# Patient Record
Sex: Male | Born: 1980 | Race: White | Hispanic: No
Health system: Midwestern US, Community
[De-identification: ages and names within clinical notes are randomized; demographics above are authoritative.]

## PROBLEM LIST (undated history)

## (undated) DIAGNOSIS — K649 Unspecified hemorrhoids: Secondary | ICD-10-CM

## (undated) DIAGNOSIS — K219 Gastro-esophageal reflux disease without esophagitis: Secondary | ICD-10-CM

## (undated) DIAGNOSIS — K449 Diaphragmatic hernia without obstruction or gangrene: Secondary | ICD-10-CM

## (undated) HISTORY — DX: Diaphragmatic hernia without obstruction or gangrene: K44.9

## (undated) HISTORY — PX: COLON SURGERY: SHX602

## (undated) HISTORY — DX: Unspecified hemorrhoids: K64.9

## (undated) HISTORY — DX: Gastro-esophageal reflux disease without esophagitis: K21.9

## (undated) HISTORY — PX: NO PAST SURGERIES: SHX2092

---
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
## 2004-04-03 ENCOUNTER — Ambulatory Visit: Payer: Self-pay

## 2008-01-19 ENCOUNTER — Emergency Department: Payer: Self-pay | Admitting: Internal Medicine

## 2009-05-10 ENCOUNTER — Ambulatory Visit: Payer: Self-pay | Admitting: Gastroenterology

## 2009-05-11 ENCOUNTER — Ambulatory Visit: Payer: Self-pay | Admitting: Gastroenterology

## 2011-04-27 ENCOUNTER — Emergency Department: Payer: Self-pay | Admitting: Emergency Medicine

## 2012-03-23 ENCOUNTER — Other Ambulatory Visit: Payer: Self-pay | Admitting: Orthopedic Surgery

## 2012-03-23 DIAGNOSIS — M25569 Pain in unspecified knee: Secondary | ICD-10-CM | POA: Insufficient documentation

## 2012-03-23 DIAGNOSIS — M25562 Pain in left knee: Secondary | ICD-10-CM

## 2012-03-27 ENCOUNTER — Ambulatory Visit
Admission: RE | Admit: 2012-03-27 | Discharge: 2012-03-27 | Disposition: A | Discharge status: Home / Self Care | Payer: PRIVATE HEALTH INSURANCE | Source: Ambulatory Visit | Attending: Orthopedic Surgery | Admitting: Orthopedic Surgery

## 2012-03-27 DIAGNOSIS — M25562 Pain in left knee: Secondary | ICD-10-CM

## 2012-10-20 ENCOUNTER — Emergency Department: Payer: Self-pay | Admitting: Emergency Medicine

## 2013-03-28 ENCOUNTER — Emergency Department: Payer: Self-pay | Admitting: Emergency Medicine

## 2013-03-28 LAB — CBC WITH DIFFERENTIAL/PLATELET
Basophil #: 0.1 10*3/uL (ref 0.0–0.1)
Basophil %: 0.6 %
Eosinophil #: 0.3 10*3/uL (ref 0.0–0.7)
Eosinophil %: 2.6 %
HCT: 47.6 % (ref 40.0–52.0)
HGB: 15.9 g/dL (ref 13.0–18.0)
LYMPHS ABS: 1.7 10*3/uL (ref 1.0–3.6)
LYMPHS PCT: 15 %
MCH: 29.7 pg (ref 26.0–34.0)
MCHC: 33.4 g/dL (ref 32.0–36.0)
MCV: 89 fL (ref 80–100)
MONO ABS: 1 x10 3/mm (ref 0.2–1.0)
MONOS PCT: 8.9 %
Neutrophil #: 8.5 10*3/uL — ABNORMAL HIGH (ref 1.4–6.5)
Neutrophil %: 72.9 %
Platelet: 223 10*3/uL (ref 150–440)
RBC: 5.35 10*6/uL (ref 4.40–5.90)
RDW: 13.5 % (ref 11.5–14.5)
WBC: 11.6 10*3/uL — AB (ref 3.8–10.6)

## 2013-03-28 LAB — URINALYSIS, COMPLETE
BILIRUBIN, UR: NEGATIVE
BLOOD: NEGATIVE
Bacteria: NONE SEEN
GLUCOSE, UR: NEGATIVE mg/dL (ref 0–75)
KETONE: NEGATIVE
Leukocyte Esterase: NEGATIVE
NITRITE: NEGATIVE
PROTEIN: NEGATIVE
Ph: 7 (ref 4.5–8.0)
RBC,UR: 2 /HPF (ref 0–5)
SPECIFIC GRAVITY: 1.02 (ref 1.003–1.030)
SQUAMOUS EPITHELIAL: NONE SEEN
WBC UR: NONE SEEN /HPF (ref 0–5)

## 2013-03-28 LAB — BASIC METABOLIC PANEL
Anion Gap: 1 — ABNORMAL LOW (ref 7–16)
BUN: 13 mg/dL (ref 7–18)
Calcium, Total: 9.1 mg/dL (ref 8.5–10.1)
Chloride: 104 mmol/L (ref 98–107)
Co2: 30 mmol/L (ref 21–32)
Creatinine: 1.36 mg/dL — ABNORMAL HIGH (ref 0.60–1.30)
EGFR (Non-African Amer.): >60
Glucose: 80 mg/dL (ref 65–99)
OSMOLALITY: 269 (ref 275–301)
Potassium: 4.4 mmol/L (ref 3.5–5.1)
SODIUM: 135 mmol/L — AB (ref 136–145)

## 2014-11-14 ENCOUNTER — Encounter: Payer: Self-pay | Admitting: Surgery

## 2014-11-14 ENCOUNTER — Ambulatory Visit (INDEPENDENT_AMBULATORY_CARE_PROVIDER_SITE_OTHER): Payer: Self-pay | Admitting: Surgery

## 2014-11-14 ENCOUNTER — Encounter (INDEPENDENT_AMBULATORY_CARE_PROVIDER_SITE_OTHER): Payer: Self-pay

## 2014-11-14 VITALS — BP 135/89 | HR 70 | Temp 98.6°F | Ht 73.0 in | Wt 152.0 lb

## 2014-11-14 DIAGNOSIS — K641 Second degree hemorrhoids: Secondary | ICD-10-CM

## 2014-11-14 MED ORDER — HYDROCORTISONE ACETATE 25 MG RE SUPP: 25.0000 mg | Freq: Two times a day (BID) | RECTAL | Status: DC | PRN

## 2014-11-14 MED ORDER — POLYETHYLENE GLYCOL 3350 17 G PO PACK: 17.0000 g | PACK | Freq: Every day | ORAL | Status: DC

## 2014-11-14 MED ORDER — LIDOCAINE 5 % EX OINT: 1.0000 "application " | TOPICAL_OINTMENT | CUTANEOUS | Status: DC | PRN

## 2014-11-14 NOTE — Patient Instructions (Signed)
You have been given several prescriptions today to help make your bowel movements easier and your hemorrhoids decrease in size and not cause you as much discomfort. Please take these to your pharmacy.  You have also been given a sheet of Primary Care Physicians. Please call and get established with a PCP to handle your other problems that you have mentioned today.

## 2014-11-14 NOTE — Progress Notes (Signed)
General Surgery Consult Note  CC: Hemorrhoids, BRBPR, straining while on toilet, urinary hesitancy/incontinence  HPI: [name] is a pleasant 34 yo M with a history of workup for intermittent bm who presents with protruding perianal masses and other vague symptoms.  Says that he has noticed that he has had hemorrhoids in the past and that these were seen on a previous colonoscopy.  Since then it has become increasingly difficult to have bm without having the sensation of needing to have more.  + BRB in toilet after BM.  Also notices anal nodules which are easily reducible which stick out when straining.  Has tried OTC suppositories with some relief.  Also complains of bilateral groin pain with BM as well as difficulty starting urinating as well as dribbling post void.  Has been embarrassed about having hemorrhoids looked at.  No fevers/chills, night sweats, shortness of breath, cough, chest pain, dysuria/hematuria.  Active Ambulatory Problems    Diagnosis Date Noted  . No Active Ambulatory Problems   Resolved Ambulatory Problems    Diagnosis Date Noted  . No Resolved Ambulatory Problems   Past Medical History  Diagnosis Date  . GERD (gastroesophageal reflux disease)   . Hiatal hernia    Past Surgical History  Procedure Laterality Date  . No past surgeries       Medication List       This list is accurate as of: 11/14/14 11:02 AM.  Always use your most recent med list.               hydrocortisone 25 MG suppository  Commonly known as:  ANUSOL-HC  Place 1 suppository (25 mg total) rectally 2 (two) times daily as needed for hemorrhoids or itching.     lidocaine 5 % ointment  Commonly known as:  XYLOCAINE  Apply 1 application topically as needed.     nicotine polacrilex 4 MG gum  Commonly known as:  NICORETTE  Take 4 mg by mouth as needed for smoking cessation.     omeprazole 20 MG capsule  Commonly known as:  PRILOSEC  Take 20 mg by mouth daily.     polyethylene glycol  packet  Commonly known as:  MIRALAX / GLYCOLAX  Take 17 g by mouth daily.       No Known Allergies   Social History   Social History  . Marital Status: Divorced    Spouse Name: N/A  . Number of Children: N/A  . Years of Education: N/A   Occupational History  . Not on file.   Social History Main Topics  . Smoking status: Former Smoker    Quit date: 11/13/2005  . Smokeless tobacco: Former Neurosurgeon    Quit date: 11/07/2014  . Alcohol Use: Yes     Comment: Occasional  . Drug Use: No  . Sexual Activity: Not on file   Other Topics Concern  . Not on file   Social History Narrative  . No narrative on file   Family History  Problem Relation Age of Onset  . ADD / ADHD Son   . Kidney disease Paternal Grandmother   . Heart disease Paternal Grandmother    ROS: Full ROS obtained, pertinent positives and negatives as above.    Blood pressure 135/89, pulse 70, temperature 98.6 F (37 C), temperature source Oral, height  (1.854 m), weight 152 lb (68.947 kg). GEN: NAD/A&Ox3 FACE: no obvious facial trauma, normal external nose, normal external ears EYES: no scleral icterus, no conjunctivitis HEAD: normocephalic  atraumatic CV: RRR, no MRG RESP: moving air well, lungs clear ABD: soft, nontender, nondistended ANUS: + internal hemorrhoids, no prolapse, congested EXT: moving all ext well, strength 5/5 NEURO: cnII-XII grossly intact, sensation intact all 4 ext  Labs: No recent labs Imaging: No recent imaging  A/P 34 yo with grade 2 hemorrhoids, what appears to be constipation as well as vague urinary symptoms.  I have discussed and recommended against surgical intervention at this time and have recommended nonoperative therapy due to his occupation which requires significant traveling.  - topical lidocaine, miralax and anusol suppositories - refer to PCP regarding urinary issues  I have personally spent > 30 min face to face time with this encounter.

## 2014-12-01 DIAGNOSIS — K641 Second degree hemorrhoids: Secondary | ICD-10-CM

## 2014-12-06 ENCOUNTER — Ambulatory Visit: Payer: Self-pay | Admitting: Surgery

## 2017-02-26 NOTE — Progress Notes (Signed)
02/27/2017 11:13 AM   Brandon Bottom Sr. October 15, 1980 [id number]  Referring provider: No referring provider defined for this encounter.  Chief Complaint  Patient presents with  . VAS Consult    New Patient    HPI: [name] is a 37 year old Caucasian presents today requesting a vasectomy.  Patient has two children, 2 sons, who desires no further biological children.  Patient denies any history of chronic prostatitis, epididymitis, orchitis, or other genital pain.  Today, we discussed what the vas deferens is, where it is located, and its function. We reviewed the procedure for vasectomy, it's risks, benefits, alternatives, and likelihood of achieving his goals.   We discussed in detail the procedure, complications, and recovery as well as the need for clearance prior to unprotected intercourse. We discussed that vasectomy does not protect against sexually transmitted diseases. We discussed that this procedure does not result in immediate sterility and that they would need to use other forms of birth control until he has been cleared with a three month negative postvasectomy semen analyses.  I explained that the procedure is considered to be permanent and that attempts at reversal have varying degrees of success. These options include vasectomy reversal, sperm retrieval, and in vitro fertilization; these can be very expensive.   We discussed the chance of postvasectomy pain syndrome which occurs in less than 5% of patients. I explained to the patient that there is no treatment to resolve this chronic pain, and that if it developed I would not be able to help resolve the issue, but that surgery is generally not needed for correction.   I explained there have even been reports of systemic like illness associated with this chronic pain, and that there was no good cure. I explained that vasectomy it is not a 100% reliable form of birth control, and the risk of pregnancy after  vasectomy is approximately 1 in 2000 men who had a negative postvasectomy semen analysis or rare non-motile sperm.  I explained that repeat vasectomy was necessary in less than 1% of vasectomy procedures when employing the type of technique that is performed in the office. I explained that he should refrain from ejaculation for approximately one week following vasectomy. I explained that there are other options for birth control which are permanent and non-permanent; we discussed these.  I explained the rates of surgical complications, such as symptomatic hematoma or infection, are low (1-2%) and vary with the surgeon's experience and criteria used to diagnose the complication.   PNH: Past Medical History:  Diagnosis Date  . GERD (gastroesophageal reflux disease)   . Hemorrhoid   . Hiatal hernia     Surgical History: Past Surgical History:  Procedure Laterality Date  . LEG SURGERY  1998  . NO PAST SURGERIES      Home Medications:  Allergies as of 02/27/2017   No Known Allergies     Medication List        Accurate as of 02/27/17 11:13 AM. Always use your most recent med list.          diazepam 10 MG tablet Commonly known as:  VALIUM Take 1 tablet (10 mg total) by mouth once for 1 dose.       Allergies: No Known Allergies  Family History: Family History  Problem Relation Age of Onset  . ADD / ADHD Son   . Kidney disease Paternal Grandmother   . Heart disease Paternal Grandmother   . Prostate  cancer Maternal Grandfather     Social History:  reports that he quit smoking about 11 years ago. He quit smokeless tobacco use about 2 years ago. He reports that he drinks alcohol. He reports that he does not use drugs.  ROS: UROLOGY Frequent Urination?: No Hard to postpone urination?: No Burning/pain with urination?: No Get up at night to urinate?: No Leakage of urine?: No Urine stream starts and stops?: No Trouble starting stream?: No Do you have to strain to urinate?:  No Blood in urine?: No Urinary tract infection?: No Sexually transmitted disease?: No Injury to kidneys or bladder?: No Painful intercourse?: No Weak stream?: No Erection problems?: No Penile pain?: No  Gastrointestinal Nausea?: No Vomiting?: No Indigestion/heartburn?: Yes Diarrhea?: No Constipation?: No  Constitutional Fever: No Night sweats?: No Weight loss?: No Fatigue?: No  Skin Skin rash/lesions?: No Itching?: No  Eyes Blurred vision?: No Double vision?: No  Ears/Nose/Throat Sore throat?: No Sinus problems?: No  Hematologic/Lymphatic Swollen glands?: No Easy bruising?: No  Cardiovascular Leg swelling?: No Chest pain?: No  Respiratory Cough?: No Shortness of breath?: No  Endocrine Excessive thirst?: No  Musculoskeletal Back pain?: No Joint pain?: No  Neurological Headaches?: No Dizziness?: No  Psychologic Depression?: No Anxiety?: No  Physical Exam: BP 118/68   Pulse 64   Ht 6\' 1"  (1.854 m)   Wt 145 lb (65.8 kg)   BMI 19.13 kg/m   Constitutional: Well nourished. Alert and oriented, No acute distress. HEENT: Cable AT, moist mucus membranes. Trachea midline, no masses. Cardiovascular: No clubbing, cyanosis, or edema. Respiratory: Normal respiratory effort, no increased work of breathing. GI: Abdomen is soft, non tender, non distended, no abdominal masses. Liver and spleen not palpable.  No hernias appreciated.  Stool sample for occult testing is not indicated.   GU: No CVA tenderness.  No bladder fullness or masses.  Patient with circumcised phallus.  Urethral meatus is patent.  No penile discharge. No penile lesions or rashes. Scrotum without lesions, cysts, rashes and/or edema.  Testicles are located scrotally bilaterally. No masses are appreciated in the testicles. Left and right epididymis are normal.  Vas deferens present bilaterally.   Rectal: Not performed.   Skin: No rashes, bruises or suspicious lesions. Lymph: No cervical or  inguinal adenopathy. Neurologic: Grossly intact, no focal deficits, moving all 4 extremities. Psychiatric: Normal mood and affect.      Assessment & Plan:   1. Vasectomy consult:  Patient has read and signed the consent.  He is given the pre-op vasectomy instruction sheet.  He is prescribed Valium 10 mg and instructed to take it 30 minutes prior to his vasectomy appointment.  He is to have a driver.  I reemphasized to the patient that this is to be considered a permanent form of birth control, that he is to use an alternative form of birth control until we receive the 3 months specimen and it is cleared of sperm and that this will not prevent STI's.  His questions are answered to his satisfaction and he understands the risks and is willing to proceed with the vasectomy.  He will schedule his vasectomy.    I spent 30 minutes in a face-to-face conversation concerning the vasectomy procedure and pre-and post op expectations.  Greater than 50% was spent in counseling & coordination of care with the patient.   Return for vasectomy.  These notes generated with voice recognition software. I apologize for typographical errors.  Michiel CowboySHANNON Ashutosh Dieguez, PA-C  [facility] [address],  [place], [place] [address] [phone]

## 2017-02-27 ENCOUNTER — Ambulatory Visit (INDEPENDENT_AMBULATORY_CARE_PROVIDER_SITE_OTHER): Payer: BLUE CROSS/BLUE SHIELD | Admitting: Urology

## 2017-02-27 ENCOUNTER — Encounter: Payer: Self-pay | Admitting: Urology

## 2017-02-27 VITALS — BP 118/68 | HR 64 | Ht 73.0 in | Wt 145.0 lb

## 2017-02-27 DIAGNOSIS — Z3009 Encounter for other general counseling and advice on contraception: Secondary | ICD-10-CM | POA: Diagnosis not present

## 2017-02-27 MED ORDER — DIAZEPAM 10 MG PO TABS: 10.0000 mg | ORAL_TABLET | Freq: Once | ORAL | 0 refills | Status: DC

## 2017-02-27 MED ORDER — DIAZEPAM 10 MG PO TABS: 10.0000 mg | ORAL_TABLET | Freq: Once | ORAL | 0 refills | Status: AC

## 2017-03-13 ENCOUNTER — Ambulatory Visit (INDEPENDENT_AMBULATORY_CARE_PROVIDER_SITE_OTHER): Payer: BLUE CROSS/BLUE SHIELD | Admitting: Urology

## 2017-03-13 ENCOUNTER — Encounter: Payer: Self-pay | Admitting: Urology

## 2017-03-13 VITALS — BP 135/68 | HR 77 | Ht 73.0 in | Wt 145.0 lb

## 2017-03-13 DIAGNOSIS — Z302 Encounter for sterilization: Secondary | ICD-10-CM | POA: Diagnosis not present

## 2017-03-13 NOTE — Progress Notes (Signed)
03/13/17  CC:  Chief Complaint  Patient presents with  . VAS    HPI: 37 yo M with 3 children who desires no further biological children presenting today for vasectomy.  Risk and benefits previously reviewed in detail.    Blood pressure 135/68, pulse 77, height 6\' 1"  (1.854 m), weight 145 lb (65.8 kg). NED. A&Ox3.   No respiratory distress   Abd soft, NT, ND Normal external genitalia with patent urethral meatus   Bilateral Vasectomy Procedure  Pre-Procedure: - Patient's scrotum was prepped and draped for vasectomy. - The vas was palpated through the scrotal skin on the left. - 1% Xylocaine was injected into the skin and surrounding tissue for placement  - In a similar manner, the vas on the right was identified, anesthetized, and stabilized.  Procedure: - An #11 blade was used to make a small stab incision in the latera scrotal overlying the vas - The left vas was isolated and brought up through the incision exposing that structure. - Bleeding points were cauterized as they occurred. - The vas was free from the surrounding structures and brought to the view. - A segment was positioned for placement with a hemostat. - A second hemostat was placed and a small segment between the two hemostats and was removed for inspection. - Each end of the transected vas lumen was fulgurated/ obliterated using needlepoint electrocautery -A fascial interposition was performed on testicular end of the vas using #3-0 chromic suture -The same procedure was performed on the right. - A single suture of #3-0 chromic catgut was used to close each lateral scrotal skin incision - A dressing was applied.  Post-Procedure: - Patient was instructed in care of the operative area - A specimen is to be delivered in 12 weeks   -Another form of contraception is to be used until post vasectomy semen analysis  Vanna ScotlandAshley Rheana Casebolt, MD

## 2017-03-25 ENCOUNTER — Ambulatory Visit: Payer: BLUE CROSS/BLUE SHIELD | Admitting: Surgery

## 2017-03-25 ENCOUNTER — Encounter: Payer: Self-pay | Admitting: Surgery

## 2017-03-25 VITALS — BP 124/85 | HR 79 | Temp 98.2°F | Ht 73.0 in | Wt 151.2 lb

## 2017-03-25 DIAGNOSIS — K648 Other hemorrhoids: Secondary | ICD-10-CM

## 2017-03-25 MED ORDER — LIDOCAINE 5 % EX OINT: 1.0000 "application " | TOPICAL_OINTMENT | Freq: Three times a day (TID) | CUTANEOUS | 0 refills | Status: DC | PRN

## 2017-03-25 MED ORDER — HYDROCORTISONE 2.5 % RE CREA: 1.0000 "application " | TOPICAL_CREAM | Freq: Two times a day (BID) | RECTAL | 0 refills | Status: DC

## 2017-03-25 NOTE — Progress Notes (Signed)
03/25/2017  History of Present Illness: Brandon BottomBenjamin D Takeshita Sr. is a 37 y.o. male who presents with a long history of internal hemorrhoids.  He was last seen in our office on 10/2014 by Dr. Juliann PulseLundquist and conservative management was started.  He did well initially but now after a few years, he reports that he's having blood with bowel movements about once per week and he also notices at times blood in his underwear.  He also describes some discomfort with ambulating.  He denies constipation but reports that sometimes he does to strain for a bowel movement.  He has tried Preparation H at home but this has not helped.  He does feel something enlarged.  Denies any abdominal pain.  He last had any blood per rectum 2 days ago.  Past Medical History: Past Medical History:  Diagnosis Date  . GERD (gastroesophageal reflux disease)   . Hemorrhoid   . Hiatal hernia      Past Surgical History: Past Surgical History:  Procedure Laterality Date  . LEG SURGERY  1998  . NO PAST SURGERIES      Home Medications: Prior to Admission medications   Medication Sig Start Date End Date Taking? Authorizing Provider  hydrocortisone (ANUSOL-HC) 2.5 % rectal cream Place 1 application rectally 2 (two) times daily. 03/25/17   Henrene DodgePiscoya, Mckynna Vanloan, MD  hydrocortisone (ANUSOL-HC) 2.5 % rectal cream Place 1 application rectally 2 (two) times daily. 03/25/17   Bailie Christenbury, Elita QuickJose, MD  lidocaine (XYLOCAINE) 5 % ointment Apply 1 application topically 3 (three) times daily as needed. May apply up to 6 times a day. 03/25/17   Henrene DodgePiscoya, Kory Rains, MD    Allergies: No Known Allergies  Review of Systems: Review of Systems  Constitutional: Negative for chills and fever.  Respiratory: Negative for shortness of breath.   Cardiovascular: Negative for chest pain.  Gastrointestinal: Positive for blood in stool. Negative for abdominal pain, diarrhea, nausea and vomiting.    Physical Exam BP 124/85   Pulse 79   Temp 98.2 F (36.8 C) (Oral)   Ht 6'  1" (1.854 m)   Wt 68.6 kg (151 lb 3.2 oz)   BMI 19.95 kg/m  CONSTITUTIONAL: No acute distress RESPIRATORY:  Lungs are clear, and breath sounds are equal bilaterally. Normal respiratory effort without pathologic use of accessory muscles. CARDIOVASCULAR: Heart is regular without murmurs, gallops, or rubs. GI: The abdomen is soft, non-distended, non-tender to palpation.  RECTAL:  External exam reveals normal size external hemorrhoids without inflammation.  On bearing down, there is no protrusion of hemorrhoidal tissue.  Rectal exam reveals mildly enlarged internal hemorrhoids in all three columns (left lateral largest), without any palpable lesions or fissures.  There was no blood on the glove. MUSCULOSKELETAL:  Normal muscle strength and tone in all four extremities.  No peripheral edema or cyanosis. NEUROLOGIC:  Motor and sensation is grossly normal.  Cranial nerves are grossly intact. PSYCH:  Alert and oriented to person, place and time. Affect is normal.   Assessment and Plan: This is a 37 y.o. male who presents with internal hemorrhoids, stage II.    Discussed with the patient the management of hemorrhoids.  He has not continued conservative management since his visit with Dr. Juliann PulseLundquist.  Patient will get a prescription for Anusol and Lidocaine ointments that he can use for discomfort and flare up of hemorrhoids.  He will also start taking Metamucil once daily and Miralax once daily to soften and bulk the stools so he does not have to strain.  He will also do Sitz baths twice daily to help soothe the hemorrhoidal tissue.    Discussed with the patient that there is no conservative management to help shrink down the size of the hemorrhoids but this management will help with discomfort and bleeding.  If there is no improvement, then he may require surgery in the form of hemorrhoidectomy.  Discussed with patient that we would do this conservative management for a month and see him back then to check  his progress.  Patient understands this plan and all of his questions have been answered.  Face-to-face time spent with the patient and care providers was 40 minutes, with more than 50% of the time spent counseling, educating, and coordinating care of the patient.     Howie Ill, MD [facility]

## 2017-03-25 NOTE — Progress Notes (Signed)
Ol

## 2017-03-25 NOTE — Patient Instructions (Addendum)
Please try Metamucil daily. Please increase your water intake to 64 ounces daily  Please take Miralax daily.  Please take sitz baths twice daily or after each bowel movement. Please see your follow up appointment listed above.

## 2017-04-17 ENCOUNTER — Emergency Department: Payer: BLUE CROSS/BLUE SHIELD

## 2017-04-17 ENCOUNTER — Encounter: Payer: Self-pay | Admitting: Emergency Medicine

## 2017-04-17 ENCOUNTER — Emergency Department
Admission: EM | Admit: 2017-04-17 | Discharge: 2017-04-17 | Disposition: A | Discharge status: Home / Self Care | Payer: BLUE CROSS/BLUE SHIELD | Attending: Emergency Medicine | Admitting: Emergency Medicine

## 2017-04-17 DIAGNOSIS — Z87891 Personal history of nicotine dependence: Secondary | ICD-10-CM | POA: Insufficient documentation

## 2017-04-17 DIAGNOSIS — R079 Chest pain, unspecified: Secondary | ICD-10-CM | POA: Insufficient documentation

## 2017-04-17 DIAGNOSIS — R1013 Epigastric pain: Secondary | ICD-10-CM | POA: Insufficient documentation

## 2017-04-17 LAB — BASIC METABOLIC PANEL
ANION GAP: 9 (ref 5–15)
BUN: 22 mg/dL — ABNORMAL HIGH (ref 6–20)
CHLORIDE: 102 mmol/L (ref 101–111)
CO2: 25 mmol/L (ref 22–32)
Calcium: 9.2 mg/dL (ref 8.9–10.3)
Creatinine, Ser: 0.89 mg/dL (ref 0.61–1.24)
GFR calc non Af Amer: >60 mL/min (ref >60)
Glucose, Bld: 85 mg/dL (ref 65–99)
Potassium: 4.2 mmol/L (ref 3.5–5.1)
Sodium: 136 mmol/L (ref 135–145)

## 2017-04-17 LAB — CBC
HEMATOCRIT: 42.1 % (ref 40.0–52.0)
HEMOGLOBIN: 13.9 g/dL (ref 13.0–18.0)
MCH: 28.2 pg (ref 26.0–34.0)
MCHC: 33 g/dL (ref 32.0–36.0)
MCV: 85.7 fL (ref 80.0–100.0)
Platelets: 311 10*3/uL (ref 150–440)
RBC: 4.91 MIL/uL (ref 4.40–5.90)
RDW: 14 % (ref 11.5–14.5)
WBC: 7.2 10*3/uL (ref 3.8–10.6)

## 2017-04-17 LAB — LIPASE, BLOOD: Lipase: 33 U/L (ref 11–51)

## 2017-04-17 LAB — HEPATIC FUNCTION PANEL
ALBUMIN: 3.9 g/dL (ref 3.5–5.0)
ALK PHOS: 79 U/L (ref 38–126)
ALT: 31 U/L (ref 17–63)
AST: 36 U/L (ref 15–41)
BILIRUBIN TOTAL: 0.7 mg/dL (ref 0.3–1.2)
Bilirubin, Direct: <0.1 mg/dL — ABNORMAL LOW (ref 0.1–0.5)
Total Protein: 7.5 g/dL (ref 6.5–8.1)

## 2017-04-17 LAB — TROPONIN I: Troponin I: <0.03 ng/mL (ref <0.03)

## 2017-04-17 MED ORDER — FAMOTIDINE 20 MG PO TABS
20.0000 mg | ORAL_TABLET | Freq: Once | ORAL | Status: AC
  Administered 2017-04-17: 20 mg via ORAL
  Filled 2017-04-17: qty 1

## 2017-04-17 MED ORDER — PANTOPRAZOLE SODIUM 40 MG PO TBEC: 40.0000 mg | DELAYED_RELEASE_TABLET | Freq: Every day | ORAL | 1 refills | Status: DC

## 2017-04-17 MED ORDER — SUCRALFATE 1 G PO TABS: 1.0000 g | ORAL_TABLET | Freq: Four times a day (QID) | ORAL | 1 refills | Status: DC

## 2017-04-17 MED ORDER — GI COCKTAIL ~~LOC~~
30.0000 mL | Freq: Once | ORAL | Status: AC
  Administered 2017-04-17: 30 mL via ORAL
  Filled 2017-04-17: qty 30

## 2017-04-17 NOTE — ED Notes (Signed)
Pt states his pain is in the RUQ "like gall bladder" area. Pt is NAD ambulatory and VSS. Awaiting EDP.

## 2017-04-17 NOTE — ED Triage Notes (Signed)
Pt [facility] c/o right side chest/ under rib pain that radiates into the back.  Patient has some shortness of breath associated with it and dizziness.  Denies any N/V.  Patient ambulatory to triage at this time and in NAD with even and unlabored respirations.  Denies any recent travels.  Patient states the pain is worse with inhalation. Patient also states that it is tender on palpation.

## 2017-04-17 NOTE — ED Triage Notes (Signed)
First Nurse NOte:  Arrives from [facility] for ED evaluation of RUQ abdominal pain x 3-4 days.  Patient is AAOx3.  Skin warm and dry. NAD.

## 2017-04-17 NOTE — ED Provider Notes (Signed)
[facility] Emergency Department Provider Note       Time seen: ----------------------------------------- 2:31 PM on 04/17/2017 -----------------------------------------   I have reviewed the triage vital signs and the nursing notes.  HISTORY   Chief Complaint Chest Pain    HPI Brandon BottomBenjamin D Snedeker Sr. is a 37 y.o. male with a history of GERD, hemorrhoids and hiatal hernia who presents to the ED for epigastric pain that radiates into his back.  Patient describes epigastric pain that goes into his back that has been worsening over the past 3-4 days.  [facility]  Pain is worse when he takes a deep breath as well as when he eats anything.  He does not take any medications at this time.  Past Medical History:  Diagnosis Date  . GERD (gastroesophageal reflux disease)   . Hemorrhoid   . Hiatal hernia     Patient Active Problem List   Diagnosis Date Noted  . Second degree hemorrhoids 11/14/2014  . Knee pain 03/23/2012    Past Surgical History:  Procedure Laterality Date  . LEG SURGERY  1998  . NO PAST SURGERIES      Allergies Iohexol and Shellfish allergy  Social History Social History   Tobacco Use  . Smoking status: Former Smoker    Last attempt to quit: 11/13/2005    Years since quitting: 11.4  . Smokeless tobacco: Former NeurosurgeonUser    Quit date: 11/07/2014  Substance Use Topics  . Alcohol use: Yes    Comment: Occasional  . Drug use: No    Review of Systems Constitutional: Negative for fever. Cardiovascular: Negative for chest pain. Respiratory: Negative for shortness of breath. Gastrointestinal: Positive for abdominal pain Genitourinary: Negative for dysuria. Musculoskeletal: Negative for back pain. Skin: Negative for rash. Neurological: Negative for headaches, focal weakness or numbness.  All systems negative/normal/unremarkable except as stated in the  HPI  ____________________________________________   PHYSICAL EXAM:  VITAL SIGNS: ED Triage Vitals  Enc Vitals Group     BP 04/17/17 0948 (!) 129/103     Pulse Rate 04/17/17 0948 75     Resp 04/17/17 0948 17     Temp 04/17/17 0948 98 F (36.7 C)     Temp Source 04/17/17 0948 Oral     SpO2 04/17/17 0948 100 %     Weight 04/17/17 0948 160 lb (72.6 kg)     Height 04/17/17 0948 6\' 1"  (1.854 m)     Head Circumference --      Peak Flow --      Pain Score 04/17/17 1016 5     Pain Loc --      Pain Edu? --      Excl. in GC? --     Constitutional: Alert and oriented. Well appearing and in no distress. Eyes: Conjunctivae are normal. Normal extraocular movements. ENT   Head: Normocephalic and atraumatic.   Nose: No congestion/rhinnorhea.   Mouth/Throat: Mucous membranes are moist.   Neck: No stridor. Cardiovascular: Normal rate, regular rhythm. No murmurs, rubs, or gallops. Respiratory: Normal respiratory effort without tachypnea nor retractions. Breath sounds are clear and equal bilaterally. No wheezes/rales/rhonchi. Gastrointestinal: Epigastric tenderness and right upper quadrant tenderness.  No rebound or guarding.  Normal bowel sounds. Musculoskeletal: Nontender with normal range of motion in extremities. No lower extremity tenderness nor edema. Neurologic:  Normal speech and language. No gross focal neurologic deficits are appreciated.  Skin:  Skin is warm, dry and intact. No rash noted.  Psychiatric: Mood and affect are normal. Speech and behavior are normal.   ____________________________________________  ED COURSE:  As part of my medical decision making, I reviewed the following data within the electronic MEDICAL RECORD NUMBER History obtained from family if available, nursing notes, old chart and ekg, as well as notes from prior ED visits. Patient presented for sinus rhythm with sinus arrhythmia, rate is 70 bpm, normal PR interval, normal QRS, normal QT., we will  assess with labs and imaging as indicated at this time.   Procedures ____________________________________________   LABS (pertinent positives/negatives)  Labs Reviewed  BASIC METABOLIC PANEL - Abnormal; Notable for the following components:      Result Value   BUN 22 (*)    All other components within normal limits  HEPATIC FUNCTION PANEL - Abnormal; Notable for the following components:   Bilirubin, Direct <0.1 (*)    All other components within normal limits  CBC  TROPONIN I  LIPASE, BLOOD    RADIOLOGY Chest x-ray is normal Right upper quadrant ultrasound is normal ____________________________________________  DIFFERENTIAL DIAGNOSIS   GERD, peptic ulcer disease, cholelithiasis, cholecystitis, pancreatitis, muscular pain  FINAL ASSESSMENT AND PLAN  Epigastric pain   Plan: Patient had presented for epigastric pain. Patient's labs are normal. Patient's imaging was also normal.  I think this likely represents GERD or peptic ulcer disease.  He will be placed on Protonix and Carafate and will be referred to GI for close outpatient follow-up.   Ulice Dash, MD   Note: This note was generated in part or whole with voice recognition software. Voice recognition is usually quite accurate but there are transcription errors that can and very often do occur. I apologize for any typographical errors that were not detected and corrected.     Emily Filbert, MD 04/17/17 1534

## 2017-04-17 NOTE — ED Notes (Signed)
esignature page was not working in this room at time of discharge

## 2017-04-29 ENCOUNTER — Ambulatory Visit: Payer: BLUE CROSS/BLUE SHIELD | Admitting: Surgery

## 2017-05-01 ENCOUNTER — Ambulatory Visit: Payer: BLUE CROSS/BLUE SHIELD | Admitting: Surgery

## 2017-05-08 ENCOUNTER — Other Ambulatory Visit: Payer: Self-pay

## 2017-05-13 ENCOUNTER — Ambulatory Visit: Payer: BLUE CROSS/BLUE SHIELD | Admitting: Surgery

## 2017-05-18 ENCOUNTER — Encounter: Payer: Self-pay | Admitting: Surgery

## 2017-05-18 ENCOUNTER — Telehealth: Payer: Self-pay | Admitting: Surgery

## 2017-05-18 NOTE — Telephone Encounter (Signed)
Unable to leave a message for the patient to contact our office, I have mailed a letter for the patient to contact our office and reschedule no showed appointment.

## 2017-06-29 ENCOUNTER — Other Ambulatory Visit: Payer: BLUE CROSS/BLUE SHIELD

## 2017-06-29 DIAGNOSIS — Z9852 Vasectomy status: Secondary | ICD-10-CM

## 2017-07-01 ENCOUNTER — Telehealth: Payer: Self-pay

## 2017-07-01 LAB — POST-VAS SPERM EVALUATION,QUAL: Volume: 2.5 mL

## 2017-07-01 NOTE — Telephone Encounter (Signed)
Pt informed

## 2017-07-01 NOTE — Telephone Encounter (Signed)
-----   Message from Vanna [place], MD sent at 07/01/2017  8:03 AM EDT ----- No sperm.  Get to go.

## 2018-09-09 ENCOUNTER — Other Ambulatory Visit: Payer: Self-pay

## 2018-09-09 DIAGNOSIS — Z20822 Contact with and (suspected) exposure to covid-19: Secondary | ICD-10-CM

## 2018-09-15 LAB — NOVEL CORONAVIRUS, NAA: SARS-CoV-2, NAA: NOT DETECTED

## 2018-10-07 NOTE — ED Notes (Signed)
ED Patient Education Note     Patient Education Materials Follows:  Musculoskeletal     Cervical Sprain    A cervical sprain is when the tissues (ligaments) that hold the neck bones in place stretch or tear.      HOME CARE     Put ice on the injured area.    ? Put ice in a plastic bag.    ? Place a towel between your skin and the bag.    ? Leave the ice on for 15?20 minutes, 3?4 times a day.      You may have been given a collar to wear. This collar keeps your neck from moving while you heal.    ? Do not take the collar off unless told by your doctor.    ? If you have long hair, keep it outside of the collar.     ? Ask your doctor before changing the position of your collar. You may need to change its position over time to make it more comfortable.     ? If you are allowed to take off the collar for cleaning or bathing, follow your doctor's instructions on how to do it safely.    ? Keep your collar clean by wiping it with mild soap and water. Dry it completely. If the collar has removable pads, remove them every 1?2 days to hand wash them with soap and water. Allow them to air dry. They should be dry before you wear them in the collar.    ? Do not drive while wearing the collar.     Only take medicine as told by your doctor.     Keep all doctor visits as told.     Keep all physical therapy visits as told.     Adjust your work station so that you have good posture while you work.     Avoid positions and activities that make your problems worse.     Warm up and stretch before being active.     GET HELP IF:     Your pain is not controlled with medicine.     You cannot take less pain medicine over time as planned.     Your activity level does not improve as expected.    GET HELP RIGHT AWAY IF:     You are bleeding.     Your stomach is upset.     You have an allergic reaction to your medicine.     You develop new problems that you cannot explain.     You lose feeling (become numb) or you cannot move any part of your body  (paralysis).     You have tingling or weakness in any part of your body.     Your symptoms get worse. Symptoms include:    ? Pain, soreness, stiffness, puffiness (swelling), or a burning feeling in your neck.     ? Pain when your neck is touched.    ? Shoulder or upper back pain.     ? Limited ability to move your neck.     ? Headache.     ? Dizziness.     ? Your hands or arms feel week, lose feeling, or tingle.     ? Muscle spasms.     ? Difficulty swallowing or chewing.     MAKE SURE YOU:     Understand these instructions.     Will watch your condition.     Will get   help right away if you are not doing well or get worse.    This information is not intended to replace advice given to you by your health care provider. Make sure you discuss any questions you have with your health care provider.    Document Released: 07/30/2007 Document Revised: 10/13/2012 Document Reviewed: 08/18/2012  Elsevier Interactive Patient Education ?2016 Elsevier Inc.
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
## 2018-10-07 NOTE — ED Notes (Signed)
ED Triage Note       ED Secondary Triage Entered On:  10/07/2018 14:20 EDT    Performed On:  10/07/2018 14:20 EDT by Ouida Sills, RN, SaraAnn               General Information   Barriers to Learning :   None evident   ED Home Meds Section :   Document assessment   [facility] ED Fall Risk Section :   Document assessment   ED Advance Directives Section :   Document assessment   ED Palliative Screen :   N/A (prefilled for <36yo)   Marcos Eke - 10/07/2018 14:20 EDT   (As Of: 10/07/2018 14:20:57 EDT)   Problems(Active)    No Chronic Problems (Cerner  :NKP )  Name of Problem:   No Chronic Problems ; Recorder:   Ouida Sills RN, Charisse March; Code:   NKP ; Last Updated:   10/07/2018 14:19 EDT ; Life Cycle Date:   10/07/2018 ; Life Cycle Status:   Active ; Vocabulary:   Cerner          Diagnoses(Active)    Neck pain  Date:   10/07/2018 ; Diagnosis Type:   Reason For Visit ; Confirmation:   Complaint of ; Clinical Dx:   Neck pain ; Classification:   Medical ; Clinical Service:   Non-Specified ; Code:   PNED ; Probability:   0 ; Diagnosis Code:   [id number]             -    Procedure History   (As Of: 10/07/2018 14:20:57 EDT)     Phoebe Perch Fall Risk Assessment Tool   Hx of falling last 3 months ED Fall :   No   Patient confused or disoriented ED Fall :   No   Patient intoxicated or sedated ED Fall :   No   Patient impaired gait ED Fall :   No   Use a mobility assistance device ED Fall :   No   Patient altered elimination ED Fall :   No   [facility] ED Fall Score :   0    Marcos Eke - 10/07/2018 14:20 EDT   ED Advance Directive   Advance Directive :   No   Ouida Sills RN, Charisse March - 10/07/2018 14:20 EDT
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
## 2018-10-07 NOTE — ED Provider Notes (Signed)
Neck pain        Patient:   Alexander Conway, Alexander Conway            MRN: [id number]            FIN: [id number]               Age:   38 years     Sex:  Male     DOB:  April 20, 1980   Associated Diagnoses:   Neck strain   Author:   Rebeca Allegra C      Basic Information   History source: Patient.   Arrival mode: Private vehicle.   History limitation: None.      History of Present Illness   38 year old male presents to the emergency department complaining of some neck pain.  The patient states that he [place] to [place] ocean yesterday while surfing and hit his head causing his head to bent backwards.  He states that he did not lose consciousness.  He had worsening pain this morning.  He called and spoke with a nurse line who suggested he come to the emergency department for evaluation.  Pain is worse with movement.      Review of Systems             Additional review of systems information: All systems reviewed as documented in chart.      Health Status   Allergies:    Allergic Reactions (Selected)  Severe  Shellfish- Anaphylactic reaction.  Unknown  Contrast media (iodine-based)- Anaphylactic reaction..      Past Medical/ Family/ Social History   Past medical/Family/Social history reviewed in patients medical record.       Physical Examination               Vital Signs               Per nurse's notes.   General:  Alert, no acute distress.    Skin:  Warm, dry, pink, Superficial abrasion to the forehead and nose.Marland Kitchen    Head:  Normocephalic.   Neck:  Supple, Fused tenderness throughout the C-spine..    Ears, nose, mouth and throat:  Oral mucosa moist.   Cardiovascular:  Regular rate and rhythm, Normal peripheral perfusion.    Respiratory:  Respirations are non-labored.   Musculoskeletal:  No tenderness, no swelling.    Neurological:  Alert and oriented to person, place, time, and situation, No focal neurological deficit observed, normal motor observed.    Lymphatics   Psychiatric:  Cooperative, appropriate mood & affect.       Medical  Decision Making   Documents reviewed:  Emergency department nurses' notes.      Impression and Plan   Diagnosis   Neck strain (ICD10-CM S16.1XXA, Discharge, Medical)   Plan   Condition: Stable.    Disposition: Discharged: Time  10/07/2018 15:11:00, to home.    Prescriptions: Launch prescriptions   Pharmacy:  Flexeril 10 mg oral tablet (Prescribe): 10 mg, 1 tabs, Oral, TID, for 5 days, PRN: spasm, 15 tabs, 0 Refill(s)  Anaprox-DS 550 mg oral tablet (Prescribe): 550 mg, 1 tabs, Oral, BID, for 5 days, 10 tabs, 0 Refill(s).    Patient was given the following educational materials: Cervical Sprain, Easy-to-Read.    Follow up with: Follow-up with your primary care physician Within 1 week.    Counseled: Patient, Regarding diagnosis, Regarding diagnostic results, Regarding treatment plan, Regarding prescription, Patient indicated understanding of instructions.    Notes: I had a  detailed discussion with the patient regarding today's presentation and evaluation and physical exam findings. I explained to the patient the diagnosis based on these factors. I explained the likely time frame and expected course. I gave detailed instructions on signs and symptoms to be monitored, with precautions to return for any new, unexpected or worsening symptoms.Marine scientist.    Signature Line     Electronically Signed on 10/07/2018 03:12 PM EDT   ________________________________________________   Wenda OverlandURNER-MD, JR, Geoffery Aultman C

## 2019-04-28 ENCOUNTER — Ambulatory Visit: Payer: Self-pay | Admitting: Surgery

## 2019-04-28 NOTE — H&P (Signed)
Subjective:  CC: Grade II hemorrhoids [K64.1]   HPI:  Brandon Knapp is a 40 y.o. male who was referrred by Carlynn Purl, MD for above. Symptoms were first noted several years ago. he has some rectal bleeding occurring frequently, with occasional exacerbations to where he wakes up with blood on his pajamas. Pain is dull and with intermittent sharp exacerbations.  Seen by Dr. Aleen Campi for this issue, but has not proceeded with excision due to insurance and work issues.  Has tried medical management with no improvement in symptoms.    Past Medical History:  has a past medical history of Chronic reflux esophagitis (03/29/2018).  Past Surgical History: none reported  Family History: family history includes No Known Problems in his mother.  Social History:  reports that he has quit smoking. He has quit using smokeless tobacco. He reports previous alcohol use. No history on file for drug.  Current Medications: has a current medication list which includes the following prescription(s): magnesium oxide and tamsulosin.  Allergies:       Allergies as of 04/25/2019 - Reviewed 04/25/2019  Allergen Reaction Noted  . Shellfish containing products Unknown 04/17/2017    ROS:  A 15 point review of systems was performed and pertinent positives and negatives noted in HPI  Objective:   BP 120/80   Pulse 87   Ht 185.4 cm (6\' 1" )   Wt 69.9 kg (154 lb)   BMI 20.32 kg/m   Constitutional :  alert, appears stated age, cooperative and no distress  Lymphatics/Throat::  no asymmetry, masses, or scars  Respiratory:  clear to auscultation bilaterally  Cardiovascular:  regular rate and rhythm  Gastrointestinal: soft, non-tender; bowel sounds normal; no masses,  no organomegaly.    Musculoskeletal: Steady gait and movement  Skin: Cool and moist  Psychiatric: Normal affect, non-agitated, not confused  Genital/Rectal: External exam positive for grade II Internal hemorrhoids with visible  ulceration and active bleeding, TTP.  No over swelling or necrosis to indicate acute thrombosis of hemorrhoid columns, which are visible circumfrentially.  Some spontaneously reduce when patient is relaxed.    LABS:   n/a  RADS: n/a Assessment:   Grade II hemorrhoids [K64.1]  Plan:  1. Grade II hemorrhoids [K64.1] Discussed risks/benefits/alternatives to surgery.  Alternatives include the options of observation, medical management.  Benefits include symptomatic relief.  I discussed  in detail and the complications related to the operation and the anesthesia, including bleeding, infection, recurrence, remote possibility of temporary or permanent fecal incontinence, poor/delayed wound healing, chronic pain, and additional procedures to address said risks. The risks of general anesthetic, if used, includes MI, CVA, sudden death or even reaction to anesthetic medications also discussed.   We also discussed typical post operative recovery which includes weeks to potentially months of anal pain, drainage, occasional bleeding, and sense of fecal urgency.    ED return precautions given for sudden increase in pain, bleeding, with possible accompanying fever, nausea, and/or vomiting.  The patient understands the risks, any and all questions were answered to the patient's satisfaction.  2. Symptoms and exam severe enough to consider hemorrhoidectomy in OR.  No obvious thrombosis so no need for I&D today in office.  Patient has elected to proceed with surgical treatment. Procedure will be scheduled.  Written consent was obtained.

## 2019-05-03 ENCOUNTER — Other Ambulatory Visit: Payer: BLUE CROSS/BLUE SHIELD

## 2019-05-04 ENCOUNTER — Other Ambulatory Visit: Payer: BLUE CROSS/BLUE SHIELD

## 2019-05-06 ENCOUNTER — Encounter: Admission: RE | Payer: Self-pay | Source: Home / Self Care

## 2019-05-06 ENCOUNTER — Ambulatory Visit: Admission: RE | Admit: 2019-05-06 | Payer: BLUE CROSS/BLUE SHIELD | Source: Home / Self Care | Admitting: Surgery

## 2019-05-06 SURGERY — HEMORRHOIDECTOMY
Anesthesia: General | Site: Rectum

## 2020-07-11 ENCOUNTER — Ambulatory Visit
Admission: EM | Admit: 2020-07-11 | Discharge: 2020-07-11 | Disposition: A | Discharge status: Home / Self Care | Payer: BLUE CROSS/BLUE SHIELD | Attending: Emergency Medicine | Admitting: Emergency Medicine

## 2020-07-11 ENCOUNTER — Other Ambulatory Visit: Payer: Self-pay

## 2020-07-11 ENCOUNTER — Encounter: Payer: Self-pay | Admitting: Emergency Medicine

## 2020-07-11 DIAGNOSIS — S61412A Laceration without foreign body of left hand, initial encounter: Secondary | ICD-10-CM | POA: Diagnosis not present

## 2020-07-11 DIAGNOSIS — Z23 Encounter for immunization: Secondary | ICD-10-CM

## 2020-07-11 MED ORDER — TETANUS-DIPHTH-ACELL PERTUSSIS 5-2.5-18.5 LF-MCG/0.5 IM SUSY
0.5000 mL | PREFILLED_SYRINGE | Freq: Once | INTRAMUSCULAR | Status: AC
  Administered 2020-07-11: 0.5 mL via INTRAMUSCULAR

## 2020-07-11 NOTE — ED Provider Notes (Signed)
Renaldo Fiddler    CSN: [id number] Arrival date & time: 07/11/20  1028      History   Chief Complaint Chief Complaint  Patient presents with  . Hand Injury    HPI Brandon TASSINARI Sr. is a 40 y.o. male.   Patient presents with a laceration on his left hand which occurred today.  He cut his hand on metal at work.  Bleeding controlled with direct pressure.  He denies numbness, weakness, paresthesias, or other symptoms.  Last tetanus unknown.  No other injuries.  The history is provided by the patient.    Past Medical History:  Diagnosis Date  . GERD (gastroesophageal reflux disease)   . Hemorrhoid   . Hiatal hernia     Patient Active Problem List   Diagnosis Date Noted  . Second degree hemorrhoids 11/14/2014  . Knee pain 03/23/2012    Past Surgical History:  Procedure Laterality Date  . COLON SURGERY    . LEG SURGERY  1998  . NO PAST SURGERIES         Home Medications    Prior to Admission medications   Medication Sig Start Date End Date Taking? Authorizing Provider  tamsulosin (FLOMAX) 0.4 MG CAPS capsule Take 0.4 mg by mouth every evening.    [provider]    Family History Family History  Problem Relation Age of Onset  . ADD / ADHD Son   . Kidney disease Paternal Grandmother   . Heart disease Paternal Grandmother   . Prostate cancer Maternal Grandfather     Social History Social History   Tobacco Use  . Smoking status: Former Smoker    Quit date: 11/13/2005    Years since quitting: 14.6  . Smokeless tobacco: Former Neurosurgeon    Quit date: 11/07/2014  Substance Use Topics  . Alcohol use: Yes    Comment: Occasional  . Drug use: No     Allergies   Iohexol and Shellfish allergy   Review of Systems Review of Systems  Constitutional: Negative for chills and fever.  Respiratory: Negative for cough and shortness of breath.   Cardiovascular: Negative for chest pain and palpitations.  Gastrointestinal: Negative for abdominal pain  and vomiting.  Musculoskeletal: Negative for arthralgias and joint swelling.  Skin: Positive for wound. Negative for color change.  Neurological: Negative for weakness and numbness.  All other systems reviewed and are negative.    Physical Exam Triage Vital Signs ED Triage Vitals  Enc Vitals Group     BP      Pulse      Resp      Temp      Temp src      SpO2      Weight      Height      Head Circumference      Peak Flow      Pain Score      Pain Loc      Pain Edu?      Excl. in GC?    No data found.  Updated Vital Signs BP 120/86 (BP Location: Left Arm)   Pulse 84   Temp 98.2 F (36.8 C) (Oral)   Resp 14   SpO2 96%   Visual Acuity Right Eye Distance:   Left Eye Distance:   Bilateral Distance:    Right Eye Near:   Left Eye Near:    Bilateral Near:     Physical Exam Vitals and nursing note reviewed.  Constitutional:  General: He is not in acute distress.    Appearance: He is well-developed.  HENT:     Head: Normocephalic and atraumatic.     Mouth/Throat:     Mouth: Mucous membranes are moist.  Eyes:     Conjunctiva/sclera: Conjunctivae normal.  Cardiovascular:     Rate and Rhythm: Normal rate and regular rhythm.     Heart sounds: Normal heart sounds.  Pulmonary:     Effort: Pulmonary effort is normal. No respiratory distress.     Breath sounds: Normal breath sounds.  Abdominal:     Palpations: Abdomen is soft.     Tenderness: There is no abdominal tenderness.  Musculoskeletal:        General: No deformity. Normal range of motion.     Cervical back: Neck supple.     Comments: Left hand and fingers: FROM, strength 5/5, sensation intact.  Skin:    General: Skin is warm and dry.     Capillary Refill: Capillary refill takes less than 2 seconds.     Findings: Lesion present.     Comments: 1.5 cm V-shaped laceration on left palm just below fifth finger.  No active bleeding.  Neurological:     General: No focal deficit present.     Mental Status:  He is alert and oriented to person, place, and time.     Sensory: No sensory deficit.     Motor: No weakness.     Gait: Gait normal.  Psychiatric:        Mood and Affect: Mood normal.        Behavior: Behavior normal.      UC Treatments / Results  Labs (all labs ordered are listed, but only abnormal results are displayed) Labs Reviewed - No data to display  EKG   Radiology No results found.  Procedures Laceration Repair  Date/Time: 07/11/2020 11:36 AM Performed by: Mickie Bail, NP Authorized by: Mickie Bail, NP   Consent:    Consent obtained:  Verbal   Consent given by:  Patient   Risks discussed:  Infection, pain, poor cosmetic result and poor wound healing Universal protocol:    Procedure explained and questions answered to patient or proxy's satisfaction: yes   Anesthesia:    Anesthesia method:  Local infiltration   Local anesthetic:  Lidocaine 1% w/o epi Laceration details:    Location:  Hand   Hand location:  L palm   Length (cm):  1.5   Depth (mm):  4 Pre-procedure details:    Preparation:  Patient was prepped and draped in usual sterile fashion Exploration:    Hemostasis achieved with:  Direct pressure   Wound exploration: wound explored through full range of motion and entire depth of wound visualized     Wound extent: no nerve damage noted and no tendon damage noted   Treatment:    Area cleansed with:  Shur-Clens   Amount of cleaning:  Standard   Irrigation solution:  Sterile saline   Irrigation method:  Syringe   Visualized foreign bodies/material removed: no   Skin repair:    Repair method:  Sutures   Suture size:  3-0   Suture material:  Nylon   Suture technique:  Simple interrupted   Number of sutures:  3 Approximation:    Approximation:  Close Repair type:    Repair type:  Simple Post-procedure details:    Dressing:  Antibiotic ointment and non-adherent dressing   Procedure completion:  Tolerated well, no immediate complications    (  including critical care time)  Medications Ordered in UC Medications  Tdap (BOOSTRIX) injection 0.5 mL (0.5 mLs Intramuscular Given 07/11/20 1130)    Initial Impression / Assessment and Plan / UC Course  I have reviewed the triage vital signs and the nursing notes.  Pertinent labs & imaging results that were available during my care of the patient were reviewed by me and considered in my medical decision making (see chart for details).   Laceration of left hand.  3 sutures.  Tetanus updated.  Wound care instructions and signs of infection discussed with patient.  Instructed him to return here for suture removal in 7 to 10 days.  Instructed him to return sooner if he notes signs of infection.  Patient agrees to plan of care.   Final Clinical Impressions(s) / UC Diagnoses   Final diagnoses:  Laceration of left hand without foreign body, initial encounter     Discharge Instructions     Your tetanus was updated today.    Return here for removal of your stitches in 7 to 10 days.  Keep your wound clean and dry.  Wash it gently twice a day with soap and water.  Apply an antibiotic cream and bandage twice a day.    Return here right away if you see signs of infection, such as increased pain, redness, pus-like drainage, warmth, fever, chills, or other concerning symptoms.         ED Prescriptions    None     PDMP not reviewed this encounter.   Mickie Bail, NP 07/11/20 1139

## 2020-07-11 NOTE — Discharge Instructions (Signed)
Your tetanus was updated today.    Return here for removal of your stitches in 7 to 10 days.  Keep your wound clean and dry.  Wash it gently twice a day with soap and water.  Apply an antibiotic cream and bandage twice a day.    Return here right away if you see signs of infection, such as increased pain, redness, pus-like drainage, warmth, fever, chills, or other concerning symptoms.

## 2020-07-11 NOTE — ED Triage Notes (Addendum)
Patient c/o LFT hand injury that happened today.   Patient sustained injury at work while " working on some wires and my hand came down on some metal".   Patient endorses bleeding has subsided at this time.   Patient has a laceration present.   Patient denies numbness and tingling.   Patient hasn't taken any medications for the injury.   Patient doesn't remember last tetanus vaccination.

## 2020-08-04 ENCOUNTER — Emergency Department
Admission: EM | Admit: 2020-08-04 | Discharge: 2020-08-04 | Disposition: A | Discharge status: Home / Self Care | Payer: BLUE CROSS/BLUE SHIELD | Attending: Emergency Medicine | Admitting: Emergency Medicine

## 2020-08-04 ENCOUNTER — Encounter: Payer: Self-pay | Admitting: Emergency Medicine

## 2020-08-04 ENCOUNTER — Emergency Department: Payer: BLUE CROSS/BLUE SHIELD

## 2020-08-04 ENCOUNTER — Other Ambulatory Visit: Payer: Self-pay

## 2020-08-04 DIAGNOSIS — R000 Tachycardia, unspecified: Secondary | ICD-10-CM | POA: Insufficient documentation

## 2020-08-04 DIAGNOSIS — Z87891 Personal history of nicotine dependence: Secondary | ICD-10-CM | POA: Insufficient documentation

## 2020-08-04 DIAGNOSIS — T364X5A Adverse effect of tetracyclines, initial encounter: Secondary | ICD-10-CM | POA: Insufficient documentation

## 2020-08-04 DIAGNOSIS — R21 Rash and other nonspecific skin eruption: Secondary | ICD-10-CM | POA: Insufficient documentation

## 2020-08-04 DIAGNOSIS — Z9189 Other specified personal risk factors, not elsewhere classified: Secondary | ICD-10-CM

## 2020-08-04 LAB — COMPREHENSIVE METABOLIC PANEL
ALT: 18 U/L (ref 0–44)
AST: 33 U/L (ref 15–41)
Albumin: 4.6 g/dL (ref 3.5–5.0)
Alkaline Phosphatase: 92 U/L (ref 38–126)
Anion gap: 9 (ref 5–15)
BUN: 15 mg/dL (ref 6–20)
CO2: 22 mmol/L (ref 22–32)
Calcium: 9.5 mg/dL (ref 8.9–10.3)
Chloride: 106 mmol/L (ref 98–111)
Creatinine, Ser: 0.96 mg/dL (ref 0.61–1.24)
GFR, Estimated: >60 mL/min (ref >60)
Glucose, Bld: 189 mg/dL — ABNORMAL HIGH (ref 70–99)
Potassium: 3.9 mmol/L (ref 3.5–5.1)
Sodium: 137 mmol/L (ref 135–145)
Total Bilirubin: 0.8 mg/dL (ref 0.3–1.2)
Total Protein: 7.9 g/dL (ref 6.5–8.1)

## 2020-08-04 LAB — CBC WITH DIFFERENTIAL/PLATELET
Abs Immature Granulocytes: 0.06 10*3/uL (ref 0.00–0.07)
Basophils Absolute: 0 10*3/uL (ref 0.0–0.1)
Basophils Relative: 0 %
Eosinophils Absolute: 0 10*3/uL (ref 0.0–0.5)
Eosinophils Relative: 0 %
HCT: 43.2 % (ref 39.0–52.0)
Hemoglobin: 14.3 g/dL (ref 13.0–17.0)
Immature Granulocytes: 1 %
Lymphocytes Relative: 10 %
Lymphs Abs: 1.2 10*3/uL (ref 0.7–4.0)
MCH: 27.2 pg (ref 26.0–34.0)
MCHC: 33.1 g/dL (ref 30.0–36.0)
MCV: 82.1 fL (ref 80.0–100.0)
Monocytes Absolute: 0.7 10*3/uL (ref 0.1–1.0)
Monocytes Relative: 6 %
Neutro Abs: 9.8 10*3/uL — ABNORMAL HIGH (ref 1.7–7.7)
Neutrophils Relative %: 83 %
Platelets: 271 10*3/uL (ref 150–400)
RBC: 5.26 MIL/uL (ref 4.22–5.81)
RDW: 13.2 % (ref 11.5–15.5)
WBC: 11.8 10*3/uL — ABNORMAL HIGH (ref 4.0–10.5)
nRBC: 0 % (ref 0.0–0.2)

## 2020-08-04 LAB — TROPONIN I (HIGH SENSITIVITY): Troponin I (High Sensitivity): 3 ng/L (ref <18)

## 2020-08-04 MED ORDER — ACETAMINOPHEN 500 MG PO TABS
1000.0000 mg | ORAL_TABLET | Freq: Once | ORAL | Status: AC
  Administered 2020-08-04: 1000 mg via ORAL
  Filled 2020-08-04: qty 2

## 2020-08-04 MED ORDER — LACTATED RINGERS IV BOLUS
1000.0000 mL | Freq: Once | INTRAVENOUS | Status: AC
  Administered 2020-08-04: 1000 mL via INTRAVENOUS

## 2020-08-04 MED ORDER — LORAZEPAM 2 MG/ML IJ SOLN
1.0000 mg | Freq: Once | INTRAMUSCULAR | Status: AC
  Administered 2020-08-04: 1 mg via INTRAVENOUS
  Filled 2020-08-04: qty 1

## 2020-08-04 MED ORDER — KETOROLAC TROMETHAMINE 30 MG/ML IJ SOLN
15.0000 mg | Freq: Once | INTRAMUSCULAR | Status: AC
  Administered 2020-08-04: 15 mg via INTRAVENOUS
  Filled 2020-08-04: qty 1

## 2020-08-04 MED ORDER — EPINEPHRINE 0.3 MG/0.3ML IJ SOAJ: 0.3000 mg | INTRAMUSCULAR | 1 refills | Status: AC | PRN

## 2020-08-04 MED ORDER — DIPHENHYDRAMINE HCL 50 MG/ML IJ SOLN
25.0000 mg | Freq: Once | INTRAMUSCULAR | Status: AC
  Administered 2020-08-04: 25 mg via INTRAVENOUS
  Filled 2020-08-04: qty 1

## 2020-08-04 NOTE — ED Notes (Signed)
Patient transported to X-ray 

## 2020-08-04 NOTE — ED Notes (Signed)
Pt states he thinks he is having an allergic reaction. Pt states he feels like his head is "pounding" and beginning to have a rash on his chest and abd. Pt states was seen earlier today in ed for same. Pt has no angioedema and no shob noted.

## 2020-08-04 NOTE — Discharge Instructions (Addendum)
Stop taking your antibiotics.  I do not believe you need antibiotics at this time.  Please take the medications prescribed by the other hospital, including prednisone and hydroxyzine.  You are being discharged with a prescription for an EpiPen to have on hand and use as needed for any severe allergic reactions.   Return to the ED with any worsening symptoms or if he have to give yourself an EpiPen.

## 2020-08-04 NOTE — ED Notes (Signed)
ED Provider at bedside. 

## 2020-08-04 NOTE — ED Triage Notes (Signed)
Pt reports he started to take bactrim for MRSA on Tuesday, reports rash to face and torso. Pt also reports developed chest pain. Pt reports had Colon surgery about 3 months ago. Pt reports was seen in the morning for allergic reaction for has not taken any prescribed medication. Pt reports itching sensation all over his body. Pt talks in complete sentences no respiratory distress noted

## 2020-08-04 NOTE — ED Notes (Signed)
Patient reports allergic reaction to medication provided for MRSA infection by [facility]. Patient reports Bactrim use since Tuesday, and reports itching, and "bloating" to abdomen today. Patient is noted to be restless, and is noted to be pacing in his room. Patient encouraged to sit in bed, monitor applied. Respirations even and unlabored. NADN.

## 2020-08-04 NOTE — ED Provider Notes (Signed)
[facility] Emergency Department Provider Note ____________________________________________   Event Date/Time   First MD Initiated Contact with Patient 08/04/20 [phone]     (approximate)  I have reviewed the triage vital signs and the nursing notes.  HISTORY  Chief Complaint Allergic Reaction   HPI Brandon DIODATO Sr. is a 40 y.o. malewho presents to the ED for evaluation of allergic reaction.   Chart review indicates patient was seen less than 24 hours ago at a different ED due to medication reaction and possible allergic reaction.  Reviewing this note indicates patient was started on doxycycline, switched to Bactrim due to hand numbness and rash, subsequently developed worsening rash with pruritus after switching to the Bactrim.  He was discharged with prednisone and hydroxyzine and advised to see ID as an outpatient, further advised to discontinue his Bactrim.   40 year old male presents to the ED for evaluation of rash and possible allergic reaction.  Patient reports that he has not yet started taking his prednisone and hydroxyzine prescribed this morning.  Patient reports about an hour prior to arrival, he began feeling diffuse pruritus and reports having a red rash "hives" throughout his entire body and his face.  He reports chest pain alongside the symptoms in the past 1 hour.  Reports feeling like it is difficult to breathe.  Denies any abdominal pain, emesis, syncopal episodes.    Past Medical History:  Diagnosis Date   GERD (gastroesophageal reflux disease)    Hemorrhoid    Hiatal hernia     Patient Active Problem List   Diagnosis Date Noted   Second degree hemorrhoids 11/14/2014   Knee pain 03/23/2012    Past Surgical History:  Procedure Laterality Date   COLON SURGERY     LEG SURGERY  1998   NO PAST SURGERIES      Prior to Admission medications   Medication Sig Start Date End Date Taking? Authorizing Provider  EPINEPHrine (EPIPEN 2-PAK)  0.3 mg/0.3 mL IJ SOAJ injection Inject 0.3 mg into the muscle as needed for anaphylaxis. 08/04/20  Yes Delton Prairie, MD  tamsulosin (FLOMAX) 0.4 MG CAPS capsule Take 0.4 mg by mouth every evening.    [provider]    Allergies Iohexol, Shellfish allergy, and Ivp dye [iodinated diagnostic agents]  Family History  Problem Relation Age of Onset   ADD / ADHD Son    Kidney disease Paternal Grandmother    Heart disease Paternal Grandmother    Prostate cancer Maternal Grandfather     Social History Social History   Tobacco Use   Smoking status: Former    Pack years: 0.00    Types: Cigarettes    Quit date: 11/13/2005    Years since quitting: 14.7   Smokeless tobacco: Former    Quit date: 11/07/2014  Substance Use Topics   Alcohol use: Yes    Comment: Occasional   Drug use: No    Review of Systems  Constitutional: No fever/chills Eyes: No visual changes. ENT: No sore throat. Cardiovascular: Positive for chest pain. Respiratory: Positive for shortness of breath. Gastrointestinal: No abdominal pain.  No nausea, no vomiting.  No diarrhea.  No constipation. Genitourinary: Negative for dysuria. Musculoskeletal: Negative for back pain. Skin: Positive for rash Neurological: Negative for headaches, focal weakness or numbness.  ____________________________________________   PHYSICAL EXAM:  VITAL SIGNS: Vitals:   08/04/20 0051 08/04/20 0211  BP: 130/85 (!) 98/53  Pulse: 90 (!) 59  Resp: 18 17  Temp:    SpO2:  99% 99%     Constitutional: Alert and oriented. Well appearing .  Quite anxious.  Sitting upright on the side of the bed without distress. He is conversational with me, and has no distress or altered voice/phonation.  When he stops speaking, particularly on examining him, he starts to have audible wheezing/stridorous respiratory breath sounds.  With encouragement and direction, he discontinues this slows his breathing without audible breath sounds. Eyes:  Conjunctivae are normal. PERRL. EOMI. Head: Atraumatic. Nose: No congestion/rhinnorhea. Mouth/Throat: Mucous membranes are moist.  Oropharynx non-erythematous. Neck: No stridor. No cervical spine tenderness to palpation. Cardiovascular: Normal rate, regular rhythm. Grossly normal heart sounds.  Good peripheral circulation. Respiratory: Normal respiratory effort.  No retractions. Lungs CTAB. Gastrointestinal: Soft , nondistended, nontender to palpation. No CVA tenderness. Musculoskeletal: No lower extremity tenderness nor edema.  No joint effusions. No signs of acute trauma. Neurologic:  Normal speech and language. No gross focal neurologic deficits are appreciated. No gait instability noted. Skin:  Skin is warm, dry and intact. No rash noted. Do not appreciate any rash throughout his body.  Certainly no hives Psychiatric: Mood and affect are anxious, but with linear thought processes.  ____________________________________________   LABS (all labs ordered are listed, but only abnormal results are displayed)  Labs Reviewed  COMPREHENSIVE METABOLIC PANEL - Abnormal; Notable for the following components:      Result Value   Glucose, Bld 189 (*)    All other components within normal limits  CBC WITH DIFFERENTIAL/PLATELET - Abnormal; Notable for the following components:   WBC 11.8 (*)    Neutro Abs 9.8 (*)    All other components within normal limits  TROPONIN I (HIGH SENSITIVITY)  TROPONIN I (HIGH SENSITIVITY)   ____________________________________________  12 Lead EKG  Sinus rhythm, rate of 68 bpm.  Normal axis and intervals.  No evidence of acute ischemia. ____________________________________________  RADIOLOGY  ED MD interpretation: 2 view CXR reviewed by me without evidence of acute cardiopulmonary pathology.  Official radiology report(s): DG Chest 2 View  Result Date: 08/04/2020 CLINICAL DATA:  Chest pain EXAM: CHEST - 2 VIEW COMPARISON:  04/17/2017 FINDINGS: Heart and  mediastinal contours are within normal limits. No focal opacities or effusions. No acute bony abnormality. IMPRESSION: No active cardiopulmonary disease. Electronically Signed   By: Charlett Nose M.D.   On: 08/04/2020 01:54    ____________________________________________   PROCEDURES and INTERVENTIONS  Procedure(s) performed (including Critical Care):  .1-3 Lead EKG Interpretation  Date/Time: 08/04/2020 1:49 AM Performed by: Delton Prairie, MD Authorized by: Delton Prairie, MD     Interpretation: normal     ECG rate:  90   ECG rate assessment: normal     Rhythm: sinus rhythm     Ectopy: none     Conduction: normal    Medications  lactated ringers bolus 1,000 mL (0 mLs Intravenous Stopped 08/04/20 0244)  LORazepam (ATIVAN) injection 1 mg (1 mg Intravenous Given 08/04/20 0122)  diphenhydrAMINE (BENADRYL) injection 25 mg (25 mg Intravenous Given 08/04/20 0122)  acetaminophen (TYLENOL) tablet 1,000 mg (1,000 mg Oral Given 08/04/20 0206)  ketorolac (TORADOL) 30 MG/ML injection 15 mg (15 mg Intravenous Given 08/04/20 0205)    ____________________________________________   MDM / ED COURSE   40 year old male presents to the ED with complaints of rash and possible allergic reaction, and ultimately amenable to outpatient management.  Presents tachycardic, which self resolves prior to medication administration, and maintains normal vital signs on room air throughout his time while roomed in the ED.  His work-up is benign and I suspect his symptoms are more attributable to anxiety than allergy.  I see no hives, rash or signs of allergic reaction.  No wheezing, upper airway obstruction.  Provide small dose of Ativan and Benadryl and IV with resolution of his symptoms.  I see no barriers to outpatient management.  Advised patient to continue the medications he was previously prescribed, including hydroxyzine and prednisone.  We discussed following up with his PCP as an outpatient and we discussed return  precautions for the ED.  Clinical Course as of 08/04/20 0254  Sat Aug 04, 2020  0130 Reassessed after Ativan and Benadryl, patient is sitting back in bed, tachycardia resolved, and looks improved.  Seems more calm and less anxious. [DS]  0251 Reassessed.  Patient resting comfortably.  His mom is at the bedside and brought him a blanket.  He reports feeling chilly, and the room is set at 66 degrees.  Continues to have clear breath sounds and breathing easily.  Normal vitals on room air.  We discussed abstinence from antibiotics at this time, following up with ID and PCP, we discussed as needed EpiPen at home and we discussed return precautions for the ED. [DS]    Clinical Course User Index [DS] Delton Prairie, MD    ____________________________________________   FINAL CLINICAL IMPRESSION(S) / ED DIAGNOSES  Final diagnoses:  At risk for allergic reaction to medication     ED Discharge Orders          Ordered    EPINEPHrine (EPIPEN 2-PAK) 0.3 mg/0.3 mL IJ SOAJ injection  As needed        08/04/20 0253             Dashon Mcintire   Note:  This document was prepared using Dragon voice recognition software and may include unintentional dictation errors.    Delton Prairie, MD 08/04/20 [phone]

## 2020-08-07 ENCOUNTER — Other Ambulatory Visit: Payer: Self-pay

## 2020-08-07 ENCOUNTER — Encounter: Payer: Self-pay | Admitting: *Deleted

## 2020-08-07 DIAGNOSIS — R1030 Lower abdominal pain, unspecified: Secondary | ICD-10-CM | POA: Insufficient documentation

## 2020-08-07 DIAGNOSIS — Z87891 Personal history of nicotine dependence: Secondary | ICD-10-CM | POA: Insufficient documentation

## 2020-08-07 DIAGNOSIS — R1110 Vomiting, unspecified: Secondary | ICD-10-CM | POA: Insufficient documentation

## 2020-08-07 LAB — URINALYSIS, COMPLETE (UACMP) WITH MICROSCOPIC
Bacteria, UA: NONE SEEN
Bilirubin Urine: NEGATIVE
Glucose, UA: NEGATIVE mg/dL
Hgb urine dipstick: NEGATIVE
Ketones, ur: 5 mg/dL — AB
Leukocytes,Ua: NEGATIVE
Nitrite: NEGATIVE
Protein, ur: NEGATIVE mg/dL
Specific Gravity, Urine: 1.024 (ref 1.005–1.030)
Squamous Epithelial / HPF: NONE SEEN (ref 0–5)
WBC, UA: NONE SEEN WBC/hpf (ref 0–5)
pH: 5 (ref 5.0–8.0)

## 2020-08-07 LAB — CBC
HCT: 43.7 % (ref 39.0–52.0)
Hemoglobin: 14.7 g/dL (ref 13.0–17.0)
MCH: 27.7 pg (ref 26.0–34.0)
MCHC: 33.6 g/dL (ref 30.0–36.0)
MCV: 82.5 fL (ref 80.0–100.0)
Platelets: 272 10*3/uL (ref 150–400)
RBC: 5.3 MIL/uL (ref 4.22–5.81)
RDW: 13.2 % (ref 11.5–15.5)
WBC: 8.3 10*3/uL (ref 4.0–10.5)
nRBC: 0 % (ref 0.0–0.2)

## 2020-08-07 LAB — COMPREHENSIVE METABOLIC PANEL
ALT: 23 U/L (ref 0–44)
AST: 24 U/L (ref 15–41)
Albumin: 4.7 g/dL (ref 3.5–5.0)
Alkaline Phosphatase: 80 U/L (ref 38–126)
Anion gap: 9 (ref 5–15)
BUN: 30 mg/dL — ABNORMAL HIGH (ref 6–20)
CO2: 27 mmol/L (ref 22–32)
Calcium: 9.2 mg/dL (ref 8.9–10.3)
Chloride: 101 mmol/L (ref 98–111)
Creatinine, Ser: 1.08 mg/dL (ref 0.61–1.24)
GFR, Estimated: >60 mL/min (ref >60)
Glucose, Bld: 95 mg/dL (ref 70–99)
Potassium: 4.2 mmol/L (ref 3.5–5.1)
Sodium: 137 mmol/L (ref 135–145)
Total Bilirubin: 1 mg/dL (ref 0.3–1.2)
Total Protein: 8.3 g/dL — ABNORMAL HIGH (ref 6.5–8.1)

## 2020-08-07 LAB — LIPASE, BLOOD: Lipase: 37 U/L (ref 11–51)

## 2020-08-07 NOTE — ED Triage Notes (Signed)
Pt has abd distention and pain.  Pt also has back pain.  Pt has vomiting x 2 today.  No diarrhea.   Pt alert  speech clear.

## 2020-08-08 ENCOUNTER — Emergency Department: Payer: BLUE CROSS/BLUE SHIELD

## 2020-08-08 ENCOUNTER — Emergency Department
Admission: EM | Admit: 2020-08-08 | Discharge: 2020-08-08 | Disposition: A | Discharge status: Home / Self Care | Payer: BLUE CROSS/BLUE SHIELD | Attending: Emergency Medicine | Admitting: Emergency Medicine

## 2020-08-08 DIAGNOSIS — R1030 Lower abdominal pain, unspecified: Secondary | ICD-10-CM

## 2020-08-08 MED ORDER — KETOROLAC TROMETHAMINE 30 MG/ML IJ SOLN
15.0000 mg | Freq: Once | INTRAMUSCULAR | Status: AC
  Administered 2020-08-08: 15 mg via INTRAVENOUS
  Filled 2020-08-08: qty 1

## 2020-08-08 MED ORDER — ONDANSETRON HCL 4 MG/2ML IJ SOLN
4.0000 mg | Freq: Once | INTRAMUSCULAR | Status: AC
  Administered 2020-08-08: 4 mg via INTRAVENOUS
  Filled 2020-08-08: qty 2

## 2020-08-08 MED ORDER — ONDANSETRON 4 MG PO TBDP: 4.0000 mg | ORAL_TABLET | Freq: Three times a day (TID) | ORAL | 0 refills | Status: AC | PRN

## 2020-08-08 NOTE — ED Notes (Signed)
Patient transported to CT 

## 2020-08-08 NOTE — Discharge Instructions (Addendum)

## 2020-08-08 NOTE — ED Notes (Signed)
ED Provider at bedside. Mother at bedside.

## 2020-08-08 NOTE — ED Provider Notes (Signed)
[facility] Emergency Department Provider Note  ____________________________________________  Time seen: Approximately 2:50 AM  I have reviewed the triage vital signs and the nursing notes.   HISTORY  Chief Complaint Abdominal Pain   HPI Brandon NAVAL Sr. is a 40 y.o. male who presents for evaluation of abdominal distention and pain.  Patient reports that he feels like his abdomen is bloated and distended.  There has been going on for about 2 days.  Has had a couple episodes of nonbloody nonbilious emesis.  He is complaining of dull lower abdominal pain radiating across to bilateral flank.  Has been having normal bowel movements.  No diarrhea or constipation, has been passing flatus.  No prior history of SBO or abdominal surgeries.  He did have hemorrhoid surgery in the past.  Denies fever or chills, dysuria or hematuria.  Chest pain or shortness of breath.   Past Medical History:  Diagnosis Date   GERD (gastroesophageal reflux disease)    Hemorrhoid    Hiatal hernia     Patient Active Problem List   Diagnosis Date Noted   Second degree hemorrhoids 11/14/2014   Knee pain 03/23/2012    Past Surgical History:  Procedure Laterality Date   COLON SURGERY     LEG SURGERY  1998   NO PAST SURGERIES      Prior to Admission medications   Medication Sig Start Date End Date Taking? Authorizing Provider  ondansetron (ZOFRAN ODT) 4 MG disintegrating tablet Take 1 tablet (4 mg total) by mouth every 8 (eight) hours as needed. 08/08/20  Yes Warren Kugelman, [place], MD  EPINEPHrine (EPIPEN 2-PAK) 0.3 mg/0.3 mL IJ SOAJ injection Inject 0.3 mg into the muscle as needed for anaphylaxis. 08/04/20   Delton Prairie, MD  tamsulosin (FLOMAX) 0.4 MG CAPS capsule Take 0.4 mg by mouth every evening.    [provider]    Allergies Iohexol, Shellfish allergy, and Ivp dye [iodinated diagnostic agents]  Family History  Problem Relation Age of Onset   ADD / ADHD Son     Kidney disease Paternal Grandmother    Heart disease Paternal Grandmother    Prostate cancer Maternal Grandfather     Social History Social History   Tobacco Use   Smoking status: Former    Pack years: 0.00    Types: Cigarettes    Quit date: 11/13/2005    Years since quitting: 14.7   Smokeless tobacco: Former    Quit date: 11/07/2014  Substance Use Topics   Alcohol use: Yes    Comment: Occasional   Drug use: No    Review of Systems  Constitutional: Negative for fever. Eyes: Negative for visual changes. ENT: Negative for sore throat. Neck: No neck pain  Cardiovascular: Negative for chest pain. Respiratory: Negative for shortness of breath. Gastrointestinal: + abdominal pain and distention, nausea, vomiting. No diarrhea. Genitourinary: Negative for dysuria. Musculoskeletal: Negative for back pain. Skin: Negative for rash. Neurological: Negative for headaches, weakness or numbness. Psych: No SI or HI  ____________________________________________   PHYSICAL EXAM:  VITAL SIGNS: ED Triage Vitals  Enc Vitals Group     BP 08/07/20 2315 138/81     Pulse Rate 08/07/20 2315 91     Resp 08/07/20 2315 20     Temp 08/07/20 2315 98.7 F (37.1 C)     Temp Source 08/07/20 2315 Oral     SpO2 08/07/20 2315 96 %     Weight 08/07/20 2314 150 lb (68 kg)  Height 08/07/20 2314 6' (1.829 m)     Head Circumference --      Peak Flow --      Pain Score 08/07/20 2314 4     Pain Loc --      Pain Edu? --      Excl. in GC? --     Constitutional: Alert and oriented. Well appearing and in no apparent distress. HEENT:      Head: Normocephalic and atraumatic.         Eyes: Conjunctivae are normal. Sclera is non-icteric.       Mouth/Throat: Mucous membranes are moist.       Neck: Supple with no signs of meningismus. Cardiovascular: Regular rate and rhythm. No murmurs, gallops, or rubs.  Respiratory: Normal respiratory effort. Lungs are clear to auscultation bilaterally.   Gastrointestinal: Soft, mildly tender to palpation in the lower quadrants, and non distended with positive bowel sounds. No rebound or guarding. NG:EXBMWUXLK testicles are descended with no tenderness to palpation, bilateral positive cremasteric reflexes are present, no swelling or erythema of the scrotum. No evidence of inguinal hernia. Musculoskeletal:  No edema, cyanosis, or erythema of extremities. Neurologic: Normal speech and language. Face is symmetric. Moving all extremities. No gross focal neurologic deficits are appreciated. Skin: Skin is warm, dry and intact. No rash noted. Psychiatric: Mood and affect are normal. Speech and behavior are normal.  ____________________________________________   LABS (all labs ordered are listed, but only abnormal results are displayed)  Labs Reviewed  COMPREHENSIVE METABOLIC PANEL - Abnormal; Notable for the following components:      Result Value   BUN 30 (*)    Total Protein 8.3 (*)    All other components within normal limits  URINALYSIS, COMPLETE (UACMP) WITH MICROSCOPIC - Abnormal; Notable for the following components:   Color, Urine YELLOW (*)    APPearance CLEAR (*)    Ketones, ur 5 (*)    All other components within normal limits  LIPASE, BLOOD  CBC   ____________________________________________  EKG  none  ____________________________________________  RADIOLOGY  I have personally reviewed the images performed during this visit and I agree with the Radiologist's read.   Interpretation by Radiologist:  CT ABDOMEN PELVIS WO CONTRAST  Result Date: 08/08/2020 CLINICAL DATA:  Abdominal distension, lower abdominal pain EXAM: CT ABDOMEN AND PELVIS WITHOUT CONTRAST TECHNIQUE: Multidetector CT imaging of the abdomen and pelvis was performed following the standard protocol without IV contrast. COMPARISON:  04/03/2004 FINDINGS: Lower chest: No acute abnormality Hepatobiliary: No focal hepatic abnormality. Gallbladder unremarkable.  Pancreas: No focal abnormality or ductal dilatation. Spleen: No focal abnormality.  Normal size. Adrenals/Urinary Tract: No adrenal abnormality. No focal renal abnormality. No stones or hydronephrosis. Urinary bladder is unremarkable. Stomach/Bowel: Stomach, large and small bowel grossly unremarkable. Normal appendix. Vascular/Lymphatic: No evidence of aneurysm or adenopathy. Reproductive: No visible focal abnormality. Other: No free fluid or free air. Musculoskeletal: No acute bony abnormality. IMPRESSION: No acute findings in the abdomen or pelvis. Electronically Signed   By: Charlett Nose M.D.   On: 08/08/2020 02:46     ____________________________________________   PROCEDURES  Procedure(s) performed: None Procedures Critical Care performed:  None ____________________________________________   INITIAL IMPRESSION / ASSESSMENT AND PLAN / ED COURSE  40 y.o. male who presents for evaluation of abdominal distention and pain.  Patient is well-appearing in no distress with normal vital signs, abdomen is soft and nondistended with mild diffuse tenderness in the lower quadrants, no localized tenderness, rebound or guarding.  UA with  no evidence of UTI.  CT showing normal appendix, no signs of SBO or diverticulitis, and no other acute findings.  Labs showing no significant electrolyte derangements, normal LFTs and lipase, no leukocytosis.  Unclear cause of patient's pain at this time.  Possible viral syndrome with the nausea and vomiting.  Will discharge home with Zofran, follow-up with PCP.  Discussed my standard return precautions for new or worsening abdominal pain, fever, or any other symptoms concerning to him.     _____________________________________________ Please note:  Patient was evaluated in Emergency Department today for the symptoms described in the history of present illness. Patient was evaluated in the context of the global COVID-19 pandemic, which necessitated consideration that the  patient might be at risk for infection with the SARS-CoV-2 virus that causes COVID-19. Institutional protocols and algorithms that pertain to the evaluation of patients at risk for COVID-19 are in a state of rapid change based on information released by regulatory bodies including the CDC and federal and state organizations. These policies and algorithms were followed during the patient's care in the ED.  Some ED evaluations and interventions may be delayed as a result of limited staffing during the pandemic.   [place] Controlled Substance Database was reviewed by me. ____________________________________________   FINAL CLINICAL IMPRESSION(S) / ED DIAGNOSES   Final diagnoses:  Lower abdominal pain      NEW MEDICATIONS STARTED DURING THIS VISIT:  ED Discharge Orders          Ordered    ondansetron (ZOFRAN ODT) 4 MG disintegrating tablet  Every 8 hours PRN        08/08/20 0254             Note:  This document was prepared using Dragon voice recognition software and may include unintentional dictation errors.    Nita Sickle, MD 08/08/20 [phone]

## 2021-12-12 IMAGING — CT CT ABD-PELV W/O CM
2 of 4 series · 16 of 46 positions shown, 18 images · non-contrast
Comparison: 04/03/2004

CLINICAL DATA: Abdominal distension, lower abdominal pain

EXAM:
CT ABDOMEN AND PELVIS WITHOUT CONTRAST
TECHNIQUE: Multidetector CT imaging of the abdomen and pelvis was performed
following the standard protocol without IV contrast.

[Series 2: axial st · axial · 0.72mm/px · z∈[-583,-148]mm · 13 of 97 slices shown, 15 images]
[im 5/97  soft-tissue]
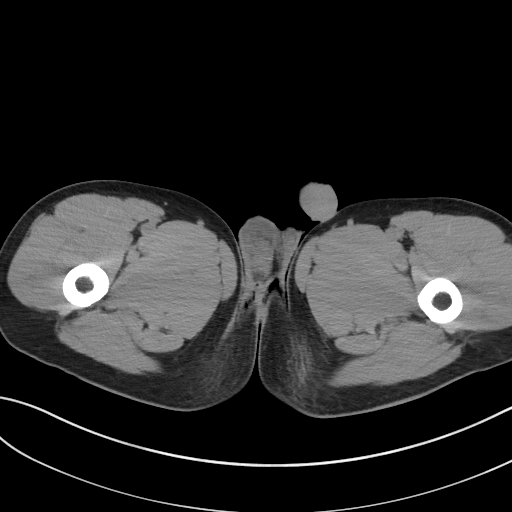
[im 5/97  bone]
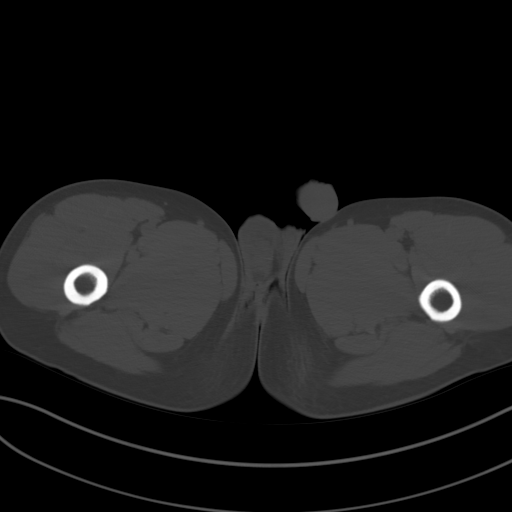
[im 13/97  soft-tissue]
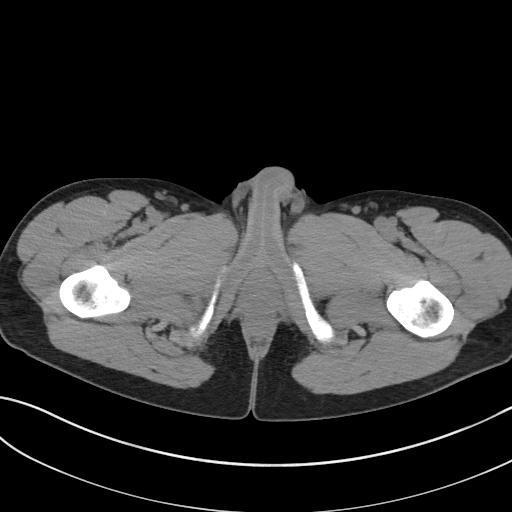
[im 21/97  soft-tissue]
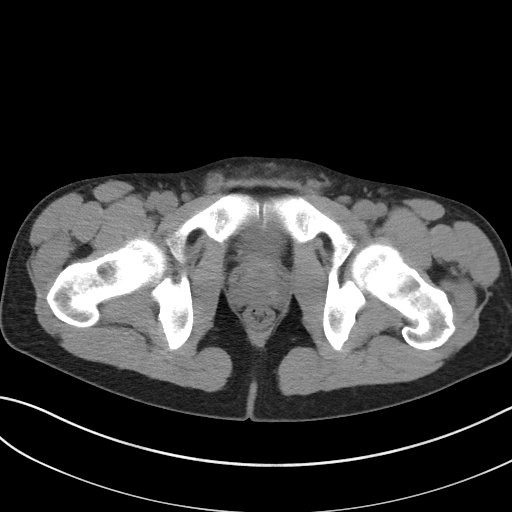
[im 26/97  soft-tissue]
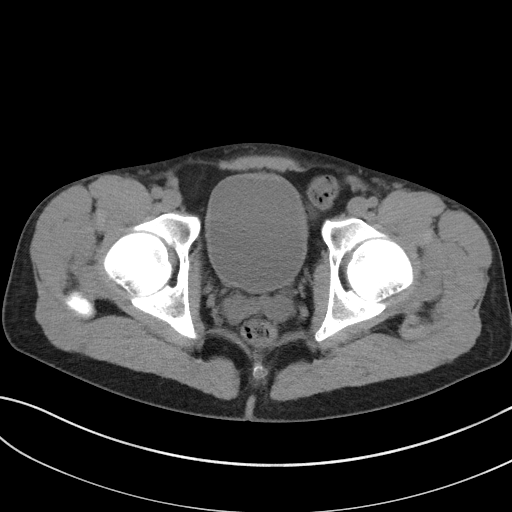
[im 34/97  soft-tissue]
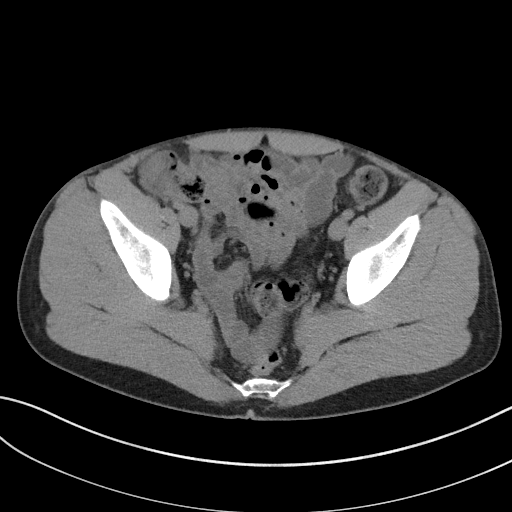
[im 42/97  soft-tissue]
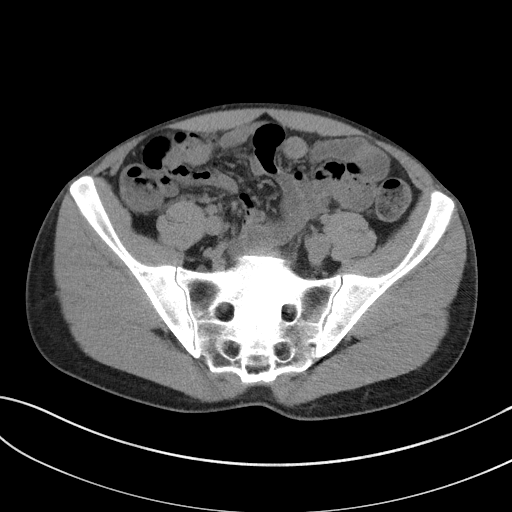
[im 51/97  soft-tissue]
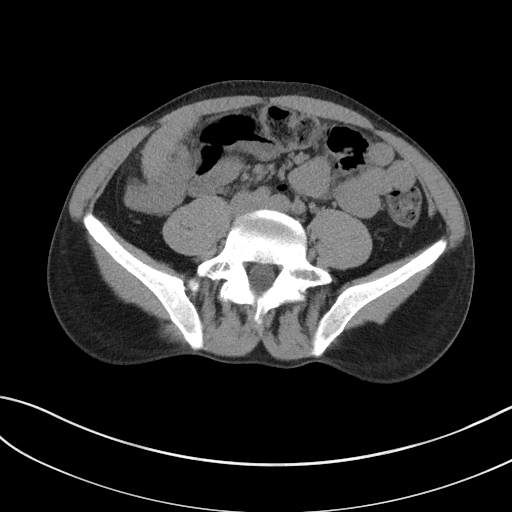
[im 55/97  soft-tissue]
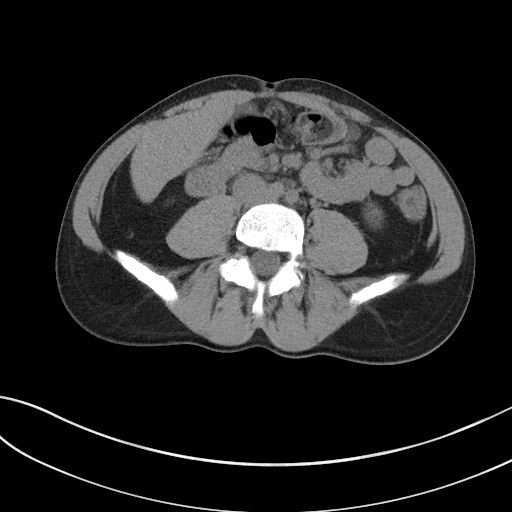
[im 63/97  soft-tissue]
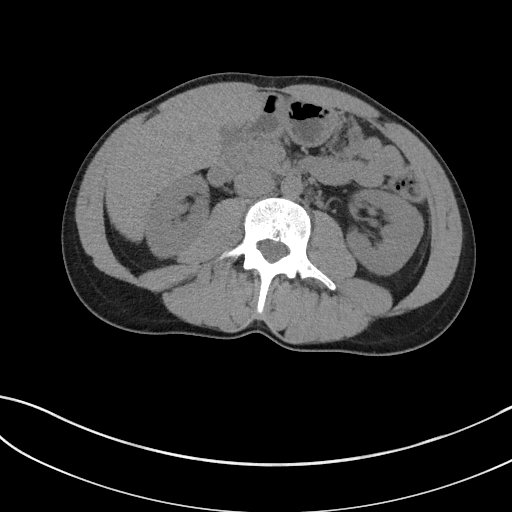
[im 63/97  bone]
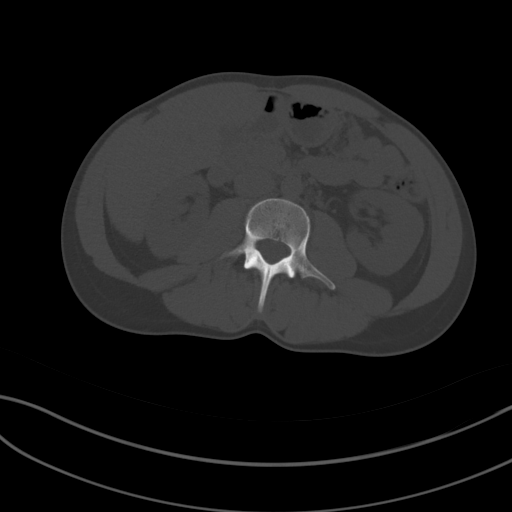
[im 71/97  soft-tissue]
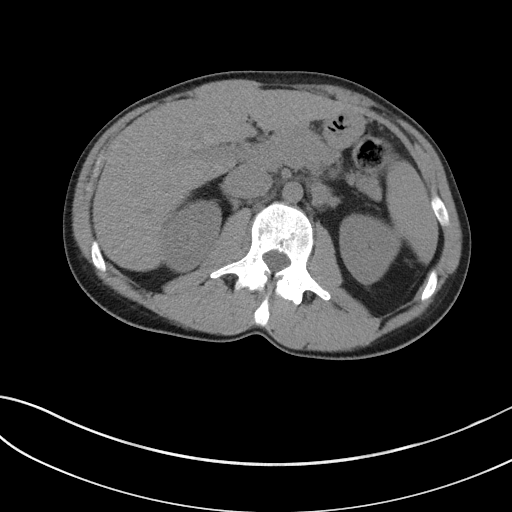
[im 76/97  soft-tissue]
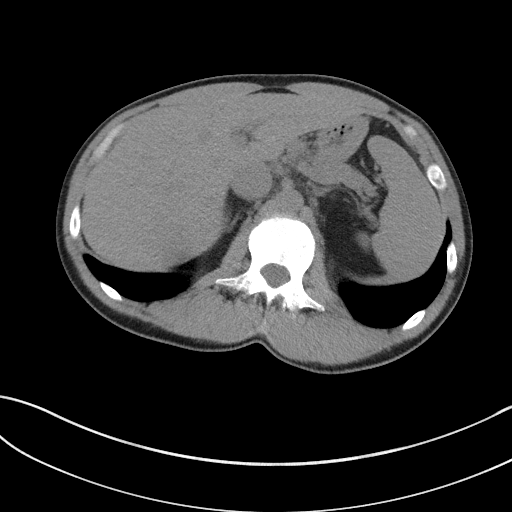
[im 84/97  soft-tissue]
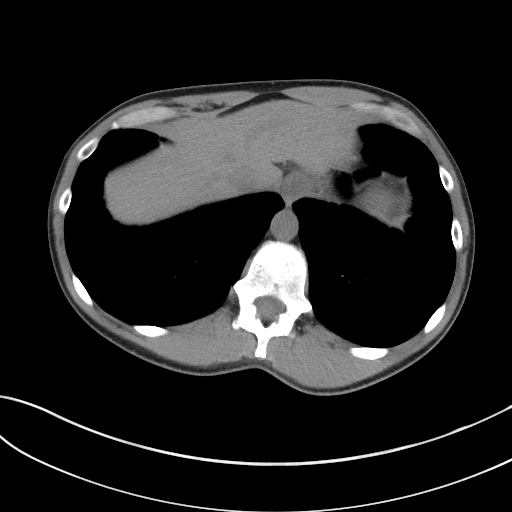
[im 92/97  soft-tissue]
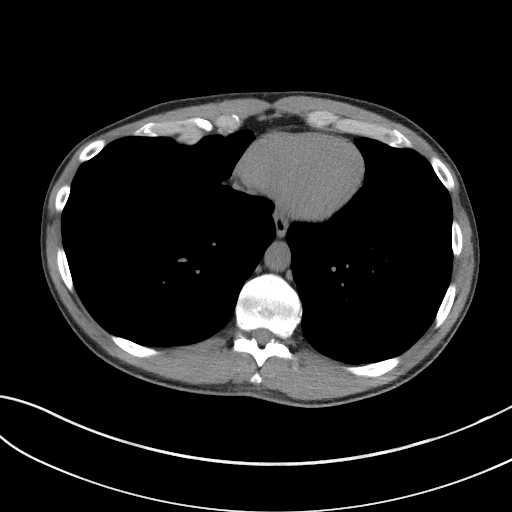

[Series 5: coronal st · coronal · 0.75mm/px · 3 of 92 slices shown]
[im 31/92  soft-tissue]
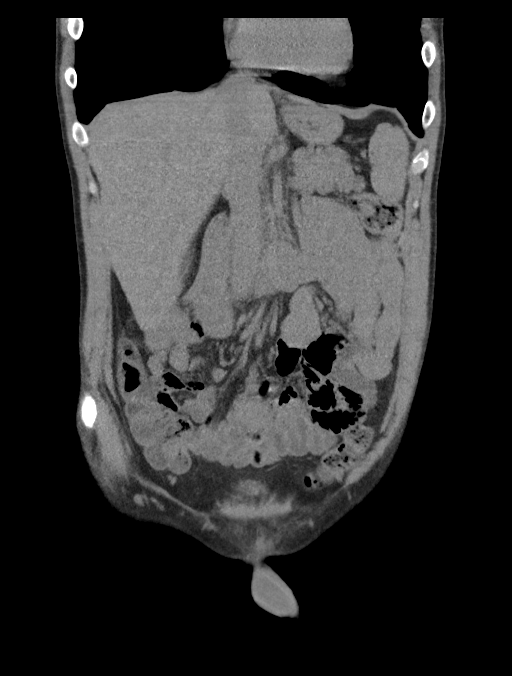
[im 41/92  soft-tissue]
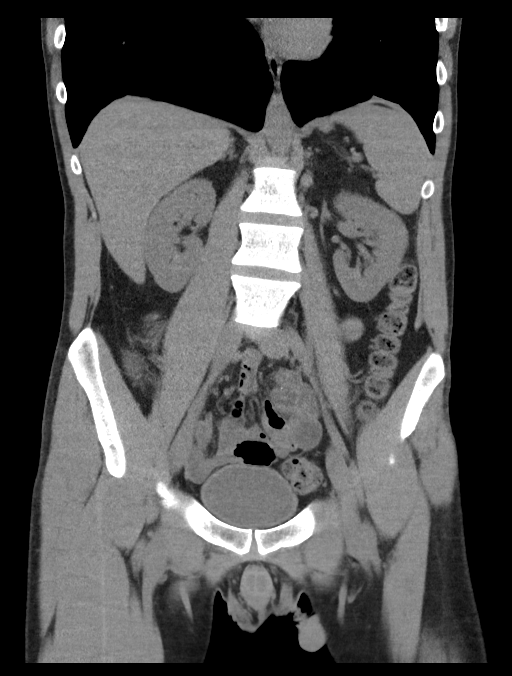
[im 51/92  soft-tissue]
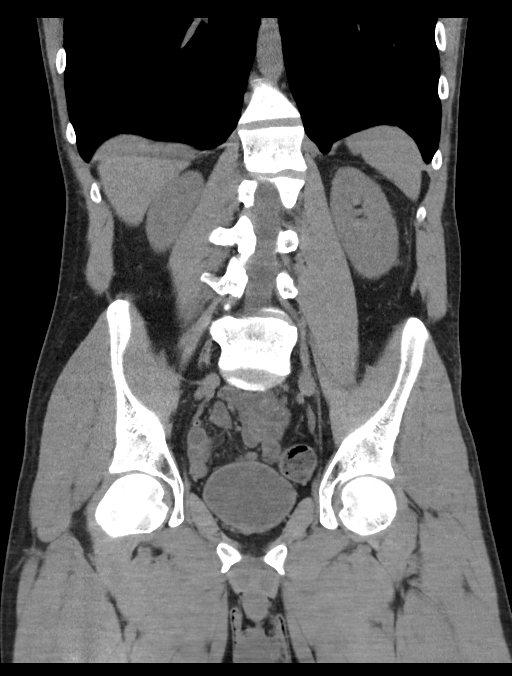

[16 of 46 positions shown; findings below may reference images not displayed]

FINDINGS: Lower chest: No acute abnormality

Hepatobiliary: No focal hepatic abnormality. Gallbladder
unremarkable.

Pancreas: No focal abnormality or ductal dilatation.

Spleen: No focal abnormality.  Normal size.

Adrenals/Urinary Tract: No adrenal abnormality. No focal renal
abnormality. No stones or hydronephrosis. Urinary bladder is
unremarkable.

Stomach/Bowel: Stomach, large and small bowel grossly unremarkable.
Normal appendix.

Vascular/Lymphatic: No evidence of aneurysm or adenopathy.

Reproductive: No visible focal abnormality.

Other: No free fluid or free air.

Musculoskeletal: No acute bony abnormality.
IMPRESSION: No acute findings in the abdomen or pelvis.

## 2022-09-15 ENCOUNTER — Emergency Department
Admission: EM | Admit: 2022-09-15 | Discharge: 2022-09-15 | Disposition: A | Discharge status: Home / Self Care | Payer: BLUE CROSS/BLUE SHIELD | Attending: Emergency Medicine | Admitting: Emergency Medicine

## 2022-09-15 ENCOUNTER — Emergency Department: Payer: BLUE CROSS/BLUE SHIELD

## 2022-09-15 ENCOUNTER — Other Ambulatory Visit: Payer: Self-pay

## 2022-09-15 ENCOUNTER — Encounter: Payer: Self-pay | Admitting: Emergency Medicine

## 2022-09-15 DIAGNOSIS — R079 Chest pain, unspecified: Secondary | ICD-10-CM | POA: Insufficient documentation

## 2022-09-15 LAB — CBC
HCT: 39.8 % (ref 39.0–52.0)
Hemoglobin: 13.9 g/dL (ref 13.0–17.0)
MCH: 29.4 pg (ref 26.0–34.0)
MCHC: 34.9 g/dL (ref 30.0–36.0)
MCV: 84.3 fL (ref 80.0–100.0)
Platelets: 216 10*3/uL (ref 150–400)
RBC: 4.72 MIL/uL (ref 4.22–5.81)
RDW: 12.2 % (ref 11.5–15.5)
WBC: 4.7 10*3/uL (ref 4.0–10.5)
nRBC: 0 % (ref 0.0–0.2)

## 2022-09-15 LAB — BASIC METABOLIC PANEL
Anion gap: 8 (ref 5–15)
BUN: 18 mg/dL (ref 6–20)
CO2: 25 mmol/L (ref 22–32)
Calcium: 9 mg/dL (ref 8.9–10.3)
Chloride: 106 mmol/L (ref 98–111)
Creatinine, Ser: 1 mg/dL (ref 0.61–1.24)
GFR, Estimated: >60 mL/min (ref >60)
Glucose, Bld: 98 mg/dL (ref 70–99)
Potassium: 3.6 mmol/L (ref 3.5–5.1)
Sodium: 139 mmol/L (ref 135–145)

## 2022-09-15 LAB — TROPONIN I (HIGH SENSITIVITY)
Troponin I (High Sensitivity): <2 ng/L (ref <18)
Troponin I (High Sensitivity): <2 ng/L (ref <18)

## 2022-09-15 LAB — D-DIMER, QUANTITATIVE: D-Dimer, Quant: <0.27 ug/mL-FEU (ref 0.00–0.50)

## 2022-09-15 NOTE — ED Provider Notes (Signed)
Followed up on repeat troponin which was negative and D-dimer negative.  Plan for discharge.   Pilar Jarvis, MD 09/15/22 [phone]

## 2022-09-15 NOTE — ED Triage Notes (Signed)
Pt presents to triage via [facility] for L side CP x 1 day. Pt notes the pain improved after being given 324mg  ASA by EMS. His pain is now a 4/10. A&Ox4 at this time. Denies fevers, chills, N/V/D, SOB.

## 2022-09-15 NOTE — Discharge Instructions (Addendum)
Fortunately your testing in the emergency department did not show any signs of emergencies like heart attack or blood clot in your lung.

## 2022-09-15 NOTE — ED Provider Notes (Signed)
[facility] Provider Note    Event Date/Time   First MD Initiated Contact with Patient 09/15/22 [phone]     (approximate)   History   Chief Complaint Chest Pain   HPI  Brandon BOYAR Sr. is a 42 y.o. male with past medical history of GERD and a hiatal hernia who presents to the ED complaining of chest pain.  Patient reports that he initially developed sharp pain in his chest and upper back before going to bed last night.  Pain seemed to ease up, but he was woken from sleep with worsening of this similar pain around 4 AM.  Pain radiates upward into both sides of his neck and is described as sharp, worse when he takes a deep breath.  He has not had any recent fevers or cough, denies any difficulty breathing.  He has not noticed any pain or swelling in his legs.  He has never had similar symptoms in the past, denies any cardiac history.     Physical Exam   Triage Vital Signs: ED Triage Vitals  Encounter Vitals Group     BP 09/15/22 0517 122/88     Systolic BP Percentile --      Diastolic BP Percentile --      Pulse Rate 09/15/22 0517 65     Resp 09/15/22 0517 16     Temp 09/15/22 0517 98.5 F (36.9 C)     Temp Source 09/15/22 0517 Oral     SpO2 09/15/22 0517 99 %     Weight 09/15/22 0520 152 lb 8.9 oz (69.2 kg)     Height 09/15/22 0520 6' (1.829 m)     Head Circumference --      Peak Flow --      Pain Score 09/15/22 0519 4     Pain Loc --      Pain Education --      Exclude from Growth Chart --     Most recent vital signs: Vitals:   09/15/22 0517  BP: 122/88  Pulse: 65  Resp: 16  Temp: 98.5 F (36.9 C)  SpO2: 99%    Constitutional: Alert and oriented. Eyes: Conjunctivae are normal. Head: Atraumatic. Nose: No congestion/rhinnorhea. Mouth/Throat: Mucous membranes are moist.  Cardiovascular: Normal rate, regular rhythm. Grossly normal heart sounds.  2+ radial pulses bilaterally. Respiratory: Normal respiratory effort.  No retractions.  Lungs CTAB. Gastrointestinal: Soft and nontender. No distention. Musculoskeletal: No lower extremity tenderness nor edema.  Neurologic:  Normal speech and language. No gross focal neurologic deficits are appreciated.    ED Results / Procedures / Treatments   Labs (all labs ordered are listed, but only abnormal results are displayed) Labs Reviewed  BASIC METABOLIC PANEL  CBC  D-DIMER, QUANTITATIVE  TROPONIN I (HIGH SENSITIVITY)  TROPONIN I (HIGH SENSITIVITY)     EKG  ED ECG REPORT I, Chesley Noon, the attending physician, personally viewed and interpreted this ECG.   Date: 09/15/2022  EKG Time: 5:19  Rate: 72  Rhythm: normal sinus rhythm  Axis: Normal  Intervals:none  ST&T Change: None  RADIOLOGY Chest x-ray reviewed and interpreted by me with no infiltrate, edema, or effusion.  PROCEDURES:  Critical Care performed: No  Procedures   MEDICATIONS ORDERED IN ED: Medications - No data to display   IMPRESSION / MDM / ASSESSMENT AND PLAN / ED COURSE  I reviewed the triage vital signs and the nursing notes.  42 y.o. male with past medical history of GERD and hiatal hernia who presents to the ED complaining of sharp pain in his chest and upper back that is worse with a deep breath since last night.  Patient's presentation is most consistent with acute presentation with potential threat to life or bodily function.  Differential diagnosis includes, but is not limited to, ACS, PE, dissection, pneumonia, pneumothorax, musculoskeletal pain, GERD, and anxiety.  Patient nontoxic-appearing and in no acute distress, vital signs are unremarkable.  EKG shows no evidence of arrhythmia or ischemia and initial troponin within normal limits.  Chest x-ray is unremarkable and remainder of labs are reassuring.  Given pleuritic symptoms, will check D-dimer.  Will also check repeat troponin given acute onset.  Patient turned over to oncoming rider pending  repeat troponin and D-dimer, suspect musculoskeletal pain if these are unremarkable.      FINAL CLINICAL IMPRESSION(S) / ED DIAGNOSES   Final diagnoses:  Nonspecific chest pain     Rx / DC Orders   ED Discharge Orders     None        Note:  This document was prepared using Dragon voice recognition software and may include unintentional dictation errors.   Chesley Noon, MD 09/15/22 [phone]
